# Patient Record
Sex: Female | Born: 1965
Health system: Southern US, Community
[De-identification: ages and names within clinical notes are randomized; demographics above are authoritative.]

## PROBLEM LIST (undated history)

## (undated) DIAGNOSIS — F32A Depression, unspecified: Secondary | ICD-10-CM

## (undated) DIAGNOSIS — M542 Cervicalgia: Secondary | ICD-10-CM

## (undated) DIAGNOSIS — M25511 Pain in right shoulder: Secondary | ICD-10-CM

## (undated) DIAGNOSIS — K219 Gastro-esophageal reflux disease without esophagitis: Secondary | ICD-10-CM

## (undated) DIAGNOSIS — N281 Cyst of kidney, acquired: Secondary | ICD-10-CM

## (undated) DIAGNOSIS — M47812 Spondylosis without myelopathy or radiculopathy, cervical region: Secondary | ICD-10-CM

## (undated) DIAGNOSIS — E782 Mixed hyperlipidemia: Secondary | ICD-10-CM

## (undated) DIAGNOSIS — R911 Solitary pulmonary nodule: Secondary | ICD-10-CM

## (undated) HISTORY — PX: INJECTION ANESTHETIC AGENT TO CERVICAL PLEXUS: SUR704

## (undated) HISTORY — DX: Pain in right shoulder: M25.511

## (undated) HISTORY — DX: Cervicalgia: M54.2

## (undated) HISTORY — DX: Mixed hyperlipidemia: E78.2

## (undated) HISTORY — DX: Cyst of kidney, acquired: N28.1

## (undated) HISTORY — DX: Depression, unspecified: F32.A

## (undated) HISTORY — DX: Solitary pulmonary nodule: R91.1

## (undated) HISTORY — DX: Spondylosis without myelopathy or radiculopathy, cervical region: M47.812

## (undated) HISTORY — DX: Gastro-esophageal reflux disease without esophagitis: K21.9

---

## 1983-12-27 DIAGNOSIS — F32A Depression, unspecified: Secondary | ICD-10-CM

## 1983-12-27 HISTORY — DX: Depression, unspecified: F32.A

## 2012-12-26 HISTORY — PX: BREAST BIOPSY: SHX20

## 2018-07-06 ENCOUNTER — Encounter: Payer: Self-pay | Admitting: Nurse Practitioner

## 2018-07-06 ENCOUNTER — Ambulatory Visit: Payer: BLUE CROSS/BLUE SHIELD | Admitting: Nurse Practitioner

## 2018-07-06 ENCOUNTER — Encounter (INDEPENDENT_AMBULATORY_CARE_PROVIDER_SITE_OTHER): Payer: Self-pay

## 2018-07-06 VITALS — BP 109/81 | HR 90 | Resp 16 | Ht 67.0 in | Wt 198.0 lb

## 2018-07-06 DIAGNOSIS — Z1239 Encounter for other screening for malignant neoplasm of breast: Secondary | ICD-10-CM

## 2018-07-06 DIAGNOSIS — E538 Deficiency of other specified B group vitamins: Secondary | ICD-10-CM

## 2018-07-06 DIAGNOSIS — E559 Vitamin D deficiency, unspecified: Secondary | ICD-10-CM | POA: Diagnosis not present

## 2018-07-06 DIAGNOSIS — Z0001 Encounter for general adult medical examination with abnormal findings: Secondary | ICD-10-CM

## 2018-07-06 DIAGNOSIS — F323 Major depressive disorder, single episode, severe with psychotic features: Secondary | ICD-10-CM | POA: Diagnosis not present

## 2018-07-06 MED ORDER — BUPROPION HCL ER (XL) 300 MG PO TB24: 300.0000 mg | ORAL_TABLET | Freq: Every day | ORAL | 4 refills | Status: DC

## 2018-07-06 MED ORDER — FLUOXETINE HCL 20 MG PO CAPS: 20.0000 mg | ORAL_CAPSULE | Freq: Every day | ORAL | 4 refills | Status: DC

## 2018-07-06 MED ORDER — CYANOCOBALAMIN 1000 MCG/ML IJ SOLN
1000.0000 ug | Freq: Once | INTRAMUSCULAR | Status: AC
  Administered 2018-07-06: 1000 ug via INTRAMUSCULAR

## 2018-07-06 NOTE — Progress Notes (Signed)
[facility] [place], [place] [address]  Internal MEDICINE  Office Visit Note  Patient Name: Catherine Cunningham  [id number]  [id number]  Date of Service: 08/01/2018  Pt is here for establishment of PCP.  Complaints/HPI   Chief Complaint  Patient presents with  . New Patient (Initial Visit)  . Fatigue  . Depression   The patient moved to [place] about 1 year ago from [place]. She has history of anxiety and depression and she is concerned about her weight. New job is relatively stressful and is working a lot of hours. Feels stressed and overwhelmed and fatigued. She knows this is contributing to her recent weight gain.    Current Medication: Outpatient Encounter Medications as of 07/06/2018  Medication Sig  . buPROPion (WELLBUTRIN XL) 300 MG 24 hr tablet Take 1 tablet (300 mg total) by mouth daily.  Marland Kitchen FLUoxetine (PROZAC) 20 MG capsule Take 1 capsule (20 mg total) by mouth daily.  . [DISCONTINUED] buPROPion (WELLBUTRIN XL) 300 MG 24 hr tablet Take 300 mg by mouth daily.  . [DISCONTINUED] FLUoxetine (PROZAC) 20 MG capsule Take 20 mg by mouth daily.  . [EXPIRED] cyanocobalamin ((VITAMIN B-12)) injection 1,000 mcg    No facility-administered encounter medications on file as of 07/06/2018.     Surgical History: Past Surgical History:  Procedure Laterality Date  . INJECTION ANESTHETIC AGENT TO CERVICAL PLEXUS      Medical History: No past medical history on file.  Family History: Family History  Problem Relation Age of Onset  . Breast cancer Mother   . Hodgkin's lymphoma Father   . Anuerysm Father   . Diabetes Mellitus I Brother     Social History   Socioeconomic History  . Marital status: Married    Spouse name: Not on file  . Number of children: Not on file  . Years of education: Not on file  . Highest education level: Not on file  Occupational History  . Not on file  Social Needs  . Financial resource strain: Not on file  . Food  insecurity:    Worry: Not on file    Inability: Not on file  . Transportation needs:    Medical: Not on file    Non-medical: Not on file  Tobacco Use  . Smoking status: Current Some Day Smoker    Types: Cigarettes  . Smokeless tobacco: Never Used  Substance and Sexual Activity  . Alcohol use: Yes    Alcohol/week: 8.4 oz    Types: 7 Glasses of wine, 7 Cans of beer per week    Comment: one a day wine or beer  . Drug use: Never  . Sexual activity: Not on file  Lifestyle  . Physical activity:    Days per week: Not on file    Minutes per session: Not on file  . Stress: Not on file  Relationships  . Social connections:    Talks on phone: Not on file    Gets together: Not on file    Attends religious service: Not on file    Active member of club or organization: Not on file    Attends meetings of clubs or organizations: Not on file    Relationship status: Not on file  . Intimate partner violence:    Fear of current or ex partner: Not on file    Emotionally abused: Not on file    Physically abused: Not on file    Forced sexual activity: Not on file  Other Topics Concern  . Not on file  Social History Narrative  . Not on file     Review of Systems  Constitutional: Positive for fatigue and unexpected weight change. Negative for activity change and chills.  HENT: Negative for congestion, postnasal drip, rhinorrhea, sneezing and sore throat.   Eyes: Negative.  Negative for redness.  Respiratory: Negative for cough, chest tightness, shortness of breath and wheezing.   Cardiovascular: Negative for chest pain and palpitations.  Gastrointestinal: Negative for abdominal pain, constipation, diarrhea, nausea and vomiting.  Endocrine: Negative for cold intolerance, heat intolerance, polydipsia, polyphagia and polyuria.  Genitourinary: Negative for dysuria and frequency.  Musculoskeletal: Negative for arthralgias, back pain, joint swelling and neck pain.  Skin: Negative for rash.   Allergic/Immunologic: Negative for environmental allergies.  Neurological: Negative for dizziness, tremors, numbness and headaches.  Hematological: Negative for adenopathy. Does not bruise/bleed easily.  Psychiatric/Behavioral: Positive for dysphoric mood. Negative for behavioral problems (Depression), sleep disturbance and suicidal ideas. The patient is nervous/anxious.     Today's Vitals   07/06/18 1521  BP: 109/81  Pulse: 90  Resp: 16  SpO2: 95%  Weight: 198 lb (89.8 kg)  Height: 5\' 7"  (1.702 m)    Physical Exam  Constitutional: She is oriented to person, place, and time. She appears well-developed and well-nourished. No distress.  HENT:  Head: Normocephalic and atraumatic.  Nose: Nose normal.  Mouth/Throat: Oropharynx is clear and moist. No oropharyngeal exudate.  Eyes: Pupils are equal, round, and reactive to light. Conjunctivae and EOM are normal.  Neck: Normal range of motion. Neck supple. No JVD present. Carotid bruit is not present. No tracheal deviation present. No thyromegaly present.  Cardiovascular: Normal rate, regular rhythm and normal heart sounds. Exam reveals no gallop and no friction rub.  No murmur heard. Pulmonary/Chest: Effort normal and breath sounds normal. No respiratory distress. She has no wheezes. She has no rales. She exhibits no tenderness.  Abdominal: Soft. Bowel sounds are normal. There is no tenderness.  Musculoskeletal: Normal range of motion.  Lymphadenopathy:    She has no cervical adenopathy.  Neurological: She is alert and oriented to person, place, and time. No cranial nerve deficit.  Skin: Skin is warm and dry. Capillary refill takes less than 2 seconds. She is not diaphoretic.  Psychiatric: She has a normal mood and affect. Her behavior is normal. Judgment and thought content normal.  Nursing note and vitals reviewed.  Assessment/Plan:  1. Vitamin B 12 deficiency b12 injection administered today - cyanocobalamin ((VITAMIN B-12))  injection 1,000 mcg  2. Vitamin D deficiency Check vitamin d levels. Treat deficiency as indicated  - Vitamin D 1,25 dihydroxy  3. Major depressive disorder, single episode with psychotic features ([place]) - buPROPion (WELLBUTRIN XL) 300 MG 24 hr tablet; Take 1 tablet (300 mg total) by mouth daily.  Dispense: 90 tablet; Refill: 4 - FLUoxetine (PROZAC) 20 MG capsule; Take 1 capsule (20 mg total) by mouth daily.  Dispense: 90 capsule; Refill: 4  4. Screening for breast cancer - MM DIGITAL SCREENING BILATERAL; Future  5. Encounter for general adult medical examination with abnormal findings - CBC with Differential/Platelet - Comprehensive metabolic panel - T4, free - TSH - Lipid panel  General Counseling: Mashelle verbalizes understanding of the findings of todays visit and agrees with plan of treatment. I have discussed any further diagnostic evaluation that may be needed or ordered today. We also reviewed her medications today. she has been encouraged to call the office with any questions or  concerns that should arise related to todays visit.    Counseling:  Obesity Counseling: Risk Assessment: An assessment of behavioral risk factors was made today and includes lack of exercise sedentary lifestyle, lack of portion control and poor dietary habits.  Risk Modification Advice: She was counseled on portion control guidelines. Restricting daily caloric intake to 1500. The detrimental long term effects of obesity on her health and ongoing poor compliance was also discussed with the patient.   There is a liability release in patients' chart. There has been a 10 minute discussion about the side effects including but not limited to elevated blood pressure, anxiety, lack of sleep and dry mouth. Pt understands and will like to start/continue on appetite suppressant at this time. There will be one month RX given at the time of visit with proper follow up. Nova diet plan with restricted calories is  given to the pt. Pt understands and agrees with  plan of treatment  Orders Placed This Encounter  Procedures  . MM DIGITAL SCREENING BILATERAL  . CBC with Differential/Platelet  . Comprehensive metabolic panel  . T4, free  . TSH  . Lipid panel  . Vitamin D 1,25 dihydroxy    Meds ordered this encounter  Medications  . buPROPion (WELLBUTRIN XL) 300 MG 24 hr tablet    Sig: Take 1 tablet (300 mg total) by mouth daily.    Dispense:  90 tablet    Refill:  4    Order Specific Question:   Supervising Provider    Answer:   Lavera Guise [[id number]]  . FLUoxetine (PROZAC) 20 MG capsule    Sig: Take 1 capsule (20 mg total) by mouth daily.    Dispense:  90 capsule    Refill:  4    Order Specific Question:   Supervising Provider    Answer:   Lavera Guise [[id number]]  . cyanocobalamin ((VITAMIN B-12)) injection 1,000 mcg    Time spent: 30 Minutes

## 2018-08-01 ENCOUNTER — Encounter: Payer: Self-pay | Admitting: Nurse Practitioner

## 2018-08-01 DIAGNOSIS — Z0001 Encounter for general adult medical examination with abnormal findings: Secondary | ICD-10-CM | POA: Insufficient documentation

## 2018-08-01 DIAGNOSIS — E559 Vitamin D deficiency, unspecified: Secondary | ICD-10-CM | POA: Insufficient documentation

## 2018-08-01 DIAGNOSIS — E538 Deficiency of other specified B group vitamins: Secondary | ICD-10-CM | POA: Insufficient documentation

## 2018-08-01 DIAGNOSIS — F323 Major depressive disorder, single episode, severe with psychotic features: Secondary | ICD-10-CM | POA: Insufficient documentation

## 2018-10-02 ENCOUNTER — Telehealth: Payer: Self-pay

## 2018-10-02 NOTE — Telephone Encounter (Signed)
Patient called regarding mammogram order, I advised patient her order was at [facility] breast center and she requested a diagnostic mammo due to hx of abnormal mammograms, I tried explaining to patient that if no abnormalities were found during exam then a baseline would be ordered since we do not have any previous records sent from her older pcp, it would have to be ordered as a regular and not diagnostic pt did not understand why we do not have records yet and disconnected the call. Beth

## 2018-10-03 ENCOUNTER — Other Ambulatory Visit: Payer: Self-pay | Admitting: Nurse Practitioner

## 2018-10-03 DIAGNOSIS — N63 Unspecified lump in unspecified breast: Secondary | ICD-10-CM

## 2018-10-03 NOTE — Progress Notes (Signed)
Scanned in patient history of mammograms. Beth

## 2018-10-22 ENCOUNTER — Ambulatory Visit
Admission: RE | Admit: 2018-10-22 | Discharge: 2018-10-22 | Disposition: A | Discharge status: Home / Self Care | Payer: BLUE CROSS/BLUE SHIELD | Source: Ambulatory Visit | Attending: Nurse Practitioner | Admitting: Nurse Practitioner

## 2018-10-22 ENCOUNTER — Other Ambulatory Visit: Payer: Self-pay | Admitting: Nurse Practitioner

## 2018-10-22 ENCOUNTER — Encounter (HOSPITAL_COMMUNITY): Payer: Self-pay

## 2018-10-22 DIAGNOSIS — N63 Unspecified lump in unspecified breast: Secondary | ICD-10-CM | POA: Diagnosis not present

## 2018-10-22 DIAGNOSIS — R921 Mammographic calcification found on diagnostic imaging of breast: Secondary | ICD-10-CM

## 2018-10-22 DIAGNOSIS — R928 Other abnormal and inconclusive findings on diagnostic imaging of breast: Secondary | ICD-10-CM

## 2018-10-22 DIAGNOSIS — Z1239 Encounter for other screening for malignant neoplasm of breast: Secondary | ICD-10-CM | POA: Diagnosis not present

## 2018-10-22 DIAGNOSIS — N6314 Unspecified lump in the right breast, lower inner quadrant: Secondary | ICD-10-CM | POA: Diagnosis not present

## 2018-10-22 DIAGNOSIS — N6313 Unspecified lump in the right breast, lower outer quadrant: Secondary | ICD-10-CM | POA: Diagnosis not present

## 2018-10-23 DIAGNOSIS — E559 Vitamin D deficiency, unspecified: Secondary | ICD-10-CM | POA: Diagnosis not present

## 2018-10-23 DIAGNOSIS — Z0001 Encounter for general adult medical examination with abnormal findings: Secondary | ICD-10-CM | POA: Diagnosis not present

## 2018-10-24 ENCOUNTER — Ambulatory Visit
Admission: RE | Admit: 2018-10-24 | Discharge: 2018-10-24 | Disposition: A | Discharge status: Home / Self Care | Payer: BLUE CROSS/BLUE SHIELD | Source: Ambulatory Visit | Attending: Nurse Practitioner | Admitting: Nurse Practitioner

## 2018-10-24 DIAGNOSIS — R928 Other abnormal and inconclusive findings on diagnostic imaging of breast: Secondary | ICD-10-CM | POA: Diagnosis not present

## 2018-10-24 DIAGNOSIS — R921 Mammographic calcification found on diagnostic imaging of breast: Secondary | ICD-10-CM | POA: Insufficient documentation

## 2018-10-24 HISTORY — PX: BREAST BIOPSY: SHX20

## 2018-10-25 LAB — SURGICAL PATHOLOGY

## 2018-10-29 LAB — CBC WITH DIFFERENTIAL/PLATELET
Basophils Absolute: 0.1 10*3/uL (ref 0.0–0.2)
Basos: 1 %
EOS (ABSOLUTE): 0.2 10*3/uL (ref 0.0–0.4)
EOS: 3 %
HEMATOCRIT: 44.2 % (ref 34.0–46.6)
Hemoglobin: 15.1 g/dL (ref 11.1–15.9)
Immature Grans (Abs): 0 10*3/uL (ref 0.0–0.1)
Immature Granulocytes: 0 %
LYMPHS ABS: 2.1 10*3/uL (ref 0.7–3.1)
Lymphs: 40 %
MCH: 29.3 pg (ref 26.6–33.0)
MCHC: 34.2 g/dL (ref 31.5–35.7)
MCV: 86 fL (ref 79–97)
MONOS ABS: 0.4 10*3/uL (ref 0.1–0.9)
Monocytes: 7 %
NEUTROS PCT: 49 %
Neutrophils Absolute: 2.5 10*3/uL (ref 1.4–7.0)
PLATELETS: 375 10*3/uL (ref 150–450)
RBC: 5.16 x10E6/uL (ref 3.77–5.28)
RDW: 12.4 % (ref 12.3–15.4)
WBC: 5.3 10*3/uL (ref 3.4–10.8)

## 2018-10-29 LAB — VITAMIN D 1,25 DIHYDROXY
VITAMIN D 1, 25 (OH) TOTAL: 35 pg/mL
Vitamin D2 1, 25 (OH)2: <10 pg/mL
Vitamin D3 1, 25 (OH)2: 35 pg/mL

## 2018-10-29 LAB — COMPREHENSIVE METABOLIC PANEL
A/G RATIO: 2.2 (ref 1.2–2.2)
ALK PHOS: 85 IU/L (ref 39–117)
ALT: 18 IU/L (ref 0–32)
AST: 18 IU/L (ref 0–40)
Albumin: 4.9 g/dL (ref 3.5–5.5)
BILIRUBIN TOTAL: 0.6 mg/dL (ref 0.0–1.2)
BUN / CREAT RATIO: 20 (ref 9–23)
BUN: 19 mg/dL (ref 6–24)
CO2: 24 mmol/L (ref 20–29)
CREATININE: 0.95 mg/dL (ref 0.57–1.00)
Calcium: 10.2 mg/dL (ref 8.7–10.2)
Chloride: 103 mmol/L (ref 96–106)
GFR calc Af Amer: 79 mL/min/{1.73_m2} (ref >59)
GFR, EST NON AFRICAN AMERICAN: 68 mL/min/{1.73_m2} (ref >59)
GLOBULIN, TOTAL: 2.2 g/dL (ref 1.5–4.5)
Glucose: 101 mg/dL — ABNORMAL HIGH (ref 65–99)
Potassium: 5.1 mmol/L (ref 3.5–5.2)
SODIUM: 142 mmol/L (ref 134–144)
Total Protein: 7.1 g/dL (ref 6.0–8.5)

## 2018-10-29 LAB — LIPID PANEL
CHOL/HDL RATIO: 2.8 ratio (ref 0.0–4.4)
Cholesterol, Total: 234 mg/dL — ABNORMAL HIGH (ref 100–199)
HDL: 85 mg/dL (ref >39)
LDL Calculated: 132 mg/dL — ABNORMAL HIGH (ref 0–99)
Triglycerides: 85 mg/dL (ref 0–149)
VLDL CHOLESTEROL CAL: 17 mg/dL (ref 5–40)

## 2018-10-29 LAB — TSH: TSH: 2.12 u[IU]/mL (ref 0.450–4.500)

## 2018-10-29 LAB — T4, FREE: FREE T4: 1.21 ng/dL (ref 0.82–1.77)

## 2019-01-07 ENCOUNTER — Ambulatory Visit: Payer: Self-pay | Admitting: Nurse Practitioner

## 2020-08-19 ENCOUNTER — Ambulatory Visit: Payer: BLUE CROSS/BLUE SHIELD | Admitting: Hospice and Palliative Medicine

## 2020-08-28 ENCOUNTER — Other Ambulatory Visit: Payer: BLUE CROSS/BLUE SHIELD

## 2020-10-02 ENCOUNTER — Ambulatory Visit: Payer: BC Managed Care – PPO | Admitting: Nurse Practitioner

## 2020-10-02 ENCOUNTER — Encounter: Payer: Self-pay | Admitting: Nurse Practitioner

## 2020-10-02 VITALS — BP 108/86 | HR 81 | Temp 97.9°F | Resp 16 | Ht 67.0 in | Wt 194.2 lb

## 2020-10-02 DIAGNOSIS — E538 Deficiency of other specified B group vitamins: Secondary | ICD-10-CM | POA: Diagnosis not present

## 2020-10-02 DIAGNOSIS — F323 Major depressive disorder, single episode, severe with psychotic features: Secondary | ICD-10-CM

## 2020-10-02 DIAGNOSIS — R5383 Other fatigue: Secondary | ICD-10-CM

## 2020-10-02 DIAGNOSIS — Z1231 Encounter for screening mammogram for malignant neoplasm of breast: Secondary | ICD-10-CM | POA: Diagnosis not present

## 2020-10-02 NOTE — Progress Notes (Signed)
[facility] [place], [place] [address]  Internal MEDICINE  Office Visit Note  Patient Name: Catherine Cunningham  [id number]  [id number]  Date of Service: 10/18/2020  Chief Complaint  Patient presents with  . Follow-up    dioagnostic mammogram, depression meds, over the last month pt has SOB and racing heart sometimes at night, pt thinks its anxiety  . Quality Metric Gaps    HepC, TDAP, pap, colonoscopy  . controlled substance policy    Reviewed    The patient is here for routine follow up. She has not been in the office for some time as she was without insurance for some time. She went for a while without medication to treat anxiety and depression. She has noted increased irritability and tearfulness. She frequently snaps at family members and friends. She is overdue to have routine, fasting labs and screening mammogram.       Current Medication: Outpatient Encounter Medications as of 10/02/2020  Medication Sig  . buPROPion (WELLBUTRIN XL) 300 MG 24 hr tablet Take 1 tablet (300 mg total) by mouth daily. (Patient not taking: Reported on 10/02/2020)  . FLUoxetine (PROZAC) 20 MG capsule Take 1 capsule (20 mg total) by mouth daily. (Patient not taking: Reported on 10/02/2020)   No facility-administered encounter medications on file as of 10/02/2020.    Surgical History: Past Surgical History:  Procedure Laterality Date  . BREAST BIOPSY Left 2014   neg- Ribbon clip  . BREAST BIOPSY Left 10/24/2018   Affirm Bx- Coil Clip- path pending  . INJECTION ANESTHETIC AGENT TO CERVICAL PLEXUS      Medical History: History reviewed. No pertinent past medical history.  Family History: Family History  Problem Relation Age of Onset  . Breast cancer Mother 42  . Hodgkin's lymphoma Father   . Anuerysm Father   . Diabetes Mellitus I Brother     Social History   Socioeconomic History  . Marital status: Married    Spouse name: Not on file  . Number of children:  Not on file  . Years of education: Not on file  . Highest education level: Not on file  Occupational History  . Not on file  Tobacco Use  . Smoking status: Current Some Day Smoker    Types: Cigarettes  . Smokeless tobacco: Never Used  . Tobacco comment: smokes when she drinks  Substance and Sexual Activity  . Alcohol use: Yes    Alcohol/week: 14.0 standard drinks    Types: 7 Glasses of wine, 7 Cans of beer per week    Comment: one a day wine or beer  . Drug use: Never  . Sexual activity: Not on file  Other Topics Concern  . Not on file  Social History Narrative  . Not on file   Social Determinants of Health   Financial Resource Strain:   . Difficulty of Paying Living Expenses: Not on file  Food Insecurity:   . Worried About Charity fundraiser in the Last Year: Not on file  . Ran Out of Food in the Last Year: Not on file  Transportation Needs:   . Lack of Transportation (Medical): Not on file  . Lack of Transportation (Non-Medical): Not on file  Physical Activity:   . Days of Exercise per Week: Not on file  . Minutes of Exercise per Session: Not on file  Stress:   . Feeling of Stress : Not on file  Social Connections:   . Frequency of Communication  with Friends and Family: Not on file  . Frequency of Social Gatherings with Friends and Family: Not on file  . Attends Religious Services: Not on file  . Active Member of Clubs or Organizations: Not on file  . Attends Archivist Meetings: Not on file  . Marital Status: Not on file  Intimate Partner Violence:   . Fear of Current or Ex-Partner: Not on file  . Emotionally Abused: Not on file  . Physically Abused: Not on file  . Sexually Abused: Not on file      Review of Systems  Constitutional: Positive for fatigue. Negative for activity change, chills and unexpected weight change.  HENT: Negative for congestion, postnasal drip, rhinorrhea, sneezing and sore throat.   Respiratory: Negative for cough, chest  tightness, shortness of breath and wheezing.   Cardiovascular: Negative for chest pain and palpitations.  Gastrointestinal: Negative for abdominal pain, constipation, diarrhea, nausea and vomiting.  Endocrine: Negative for cold intolerance, heat intolerance, polydipsia and polyuria.  Musculoskeletal: Negative for arthralgias, back pain, joint swelling, neck pain and neck stiffness.  Skin: Negative for rash.  Allergic/Immunologic: Negative for environmental allergies.  Neurological: Negative for dizziness, tremors, numbness and headaches.  Hematological: Negative for adenopathy. Does not bruise/bleed easily.  Psychiatric/Behavioral: Positive for dysphoric mood. Negative for behavioral problems (Depression), sleep disturbance and suicidal ideas. The patient is nervous/anxious.     Today's Vitals   10/02/20 1043  BP: 108/86  Pulse: 81  Resp: 16  Temp: 97.9 F (36.6 C)  SpO2: 98%  Weight: 194 lb 3.4 oz (88.1 kg)  Height: 5\' 7"  (1.702 m)   Body mass index is 30.42 kg/m.  Physical Exam Vitals and nursing note reviewed.  Constitutional:      General: She is not in acute distress.    Appearance: Normal appearance. She is well-developed. She is obese. She is not diaphoretic.  HENT:     Head: Normocephalic and atraumatic.     Nose: Nose normal.     Mouth/Throat:     Pharynx: No oropharyngeal exudate.  Eyes:     Pupils: Pupils are equal, round, and reactive to light.  Neck:     Thyroid: No thyromegaly.     Vascular: No carotid bruit or JVD.     Trachea: No tracheal deviation.  Cardiovascular:     Rate and Rhythm: Normal rate and regular rhythm.     Heart sounds: Normal heart sounds. No murmur heard.  No friction rub. No gallop.   Pulmonary:     Effort: Pulmonary effort is normal. No respiratory distress.     Breath sounds: Normal breath sounds. No wheezing or rales.  Chest:     Chest wall: No tenderness.  Abdominal:     Palpations: Abdomen is soft.  Musculoskeletal:         General: Normal range of motion.     Cervical back: Normal range of motion and neck supple.  Lymphadenopathy:     Cervical: No cervical adenopathy.  Skin:    General: Skin is warm and dry.  Neurological:     General: No focal deficit present.     Mental Status: She is alert and oriented to person, place, and time.     Cranial Nerves: No cranial nerve deficit.  Psychiatric:        Attention and Perception: Attention and perception normal.        Mood and Affect: Mood is anxious and depressed. Affect is tearful.  Speech: Speech normal.        Behavior: Behavior normal. Behavior is cooperative.        Thought Content: Thought content normal.        Cognition and Memory: Cognition and memory normal.        Judgment: Judgment normal.     Assessment/Plan: 1. Other fatigue Check labs including thyroid and anemia panels for further evaluation.  2. Vitamin B 12 deficiency Check labs including thyroid and anemia panels for further evaluation.  3. Major depressive disorder, single episode with psychotic features ([place]) Restart wellbutrin as previously prescribed. Written prescription provided today.   4. Encounter for screening mammogram for malignant neoplasm of breast - MM DIGITAL SCREENING BILATERAL; Future  General Counseling: lorrane mccay understanding of the findings of todays visit and agrees with plan of treatment. I have discussed any further diagnostic evaluation that may be needed or ordered today. We also reviewed her medications today. she has been encouraged to call the office with any questions or concerns that should arise related to todays visit.  This patient was seen by Leretha Pol FNP Collaboration with Dr Lavera Guise as a part of collaborative care agreement  Orders Placed This Encounter  Procedures  . MM DIGITAL SCREENING BILATERAL      Total time spent: 30 Minutes   Time spent includes review of chart, medications, test results, and follow up  plan with the patient.      Dr Lavera Guise Internal medicine

## 2020-10-18 ENCOUNTER — Encounter: Payer: Self-pay | Admitting: Nurse Practitioner

## 2020-10-18 DIAGNOSIS — Z1231 Encounter for screening mammogram for malignant neoplasm of breast: Secondary | ICD-10-CM

## 2020-10-18 DIAGNOSIS — R5383 Other fatigue: Secondary | ICD-10-CM | POA: Insufficient documentation

## 2020-10-18 HISTORY — DX: Encounter for screening mammogram for malignant neoplasm of breast: Z12.31

## 2020-11-13 ENCOUNTER — Other Ambulatory Visit: Payer: BC Managed Care – PPO | Admitting: Nurse Practitioner

## 2020-12-02 DIAGNOSIS — M25562 Pain in left knee: Secondary | ICD-10-CM | POA: Insufficient documentation

## 2020-12-02 DIAGNOSIS — M238X2 Other internal derangements of left knee: Secondary | ICD-10-CM | POA: Diagnosis not present

## 2020-12-08 DIAGNOSIS — M25562 Pain in left knee: Secondary | ICD-10-CM | POA: Diagnosis not present

## 2020-12-08 DIAGNOSIS — M23332 Other meniscus derangements, other medial meniscus, left knee: Secondary | ICD-10-CM | POA: Diagnosis not present

## 2020-12-15 ENCOUNTER — Other Ambulatory Visit: Payer: Self-pay | Admitting: Nurse Practitioner

## 2020-12-15 DIAGNOSIS — F323 Major depressive disorder, single episode, severe with psychotic features: Secondary | ICD-10-CM

## 2020-12-15 MED ORDER — FLUOXETINE HCL 20 MG PO CAPS: 20.0000 mg | ORAL_CAPSULE | Freq: Every day | ORAL | 1 refills | Status: DC

## 2020-12-15 MED ORDER — BUPROPION HCL ER (XL) 300 MG PO TB24: 300.0000 mg | ORAL_TABLET | Freq: Every day | ORAL | 1 refills | Status: DC

## 2020-12-16 ENCOUNTER — Other Ambulatory Visit: Payer: BC Managed Care – PPO | Admitting: Nurse Practitioner

## 2021-02-08 ENCOUNTER — Other Ambulatory Visit: Payer: Self-pay

## 2021-02-08 ENCOUNTER — Ambulatory Visit
Admission: RE | Admit: 2021-02-08 | Discharge: 2021-02-08 | Disposition: A | Discharge status: Home / Self Care | Payer: BC Managed Care – PPO | Source: Ambulatory Visit | Attending: Nurse Practitioner | Admitting: Nurse Practitioner

## 2021-02-08 DIAGNOSIS — Z1231 Encounter for screening mammogram for malignant neoplasm of breast: Secondary | ICD-10-CM | POA: Diagnosis not present

## 2021-02-10 ENCOUNTER — Other Ambulatory Visit: Payer: BC Managed Care – PPO | Admitting: Hospice and Palliative Medicine

## 2021-02-10 ENCOUNTER — Ambulatory Visit: Payer: Self-pay

## 2021-02-12 NOTE — Progress Notes (Signed)
Negative mammogram

## 2021-02-22 ENCOUNTER — Encounter: Payer: Self-pay | Admitting: Nurse Practitioner

## 2021-02-22 ENCOUNTER — Ambulatory Visit: Payer: BC Managed Care – PPO | Admitting: Nurse Practitioner

## 2021-02-22 ENCOUNTER — Telehealth: Payer: Self-pay | Admitting: Nurse Practitioner

## 2021-02-22 ENCOUNTER — Other Ambulatory Visit: Payer: Self-pay

## 2021-02-22 VITALS — BP 112/76 | HR 63 | Temp 98.4°F | Ht 67.0 in | Wt 198.1 lb

## 2021-02-22 DIAGNOSIS — E559 Vitamin D deficiency, unspecified: Secondary | ICD-10-CM

## 2021-02-22 DIAGNOSIS — Z1211 Encounter for screening for malignant neoplasm of colon: Secondary | ICD-10-CM

## 2021-02-22 DIAGNOSIS — F323 Major depressive disorder, single episode, severe with psychotic features: Secondary | ICD-10-CM | POA: Diagnosis not present

## 2021-02-22 DIAGNOSIS — H6123 Impacted cerumen, bilateral: Secondary | ICD-10-CM | POA: Diagnosis not present

## 2021-02-22 DIAGNOSIS — E538 Deficiency of other specified B group vitamins: Secondary | ICD-10-CM

## 2021-02-22 DIAGNOSIS — Z0000 Encounter for general adult medical examination without abnormal findings: Secondary | ICD-10-CM | POA: Insufficient documentation

## 2021-02-22 DIAGNOSIS — Z7689 Persons encountering health services in other specified circumstances: Secondary | ICD-10-CM | POA: Diagnosis not present

## 2021-02-22 HISTORY — DX: Encounter for screening for malignant neoplasm of colon: Z12.11

## 2021-02-22 HISTORY — DX: Encounter for general adult medical examination without abnormal findings: Z00.00

## 2021-02-22 MED ORDER — BUPROPION HCL ER (XL) 300 MG PO TB24: 300.0000 mg | ORAL_TABLET | Freq: Every day | ORAL | 1 refills | Status: DC

## 2021-02-22 MED ORDER — FLUOXETINE HCL 20 MG PO CAPS: 20.0000 mg | ORAL_CAPSULE | Freq: Every day | ORAL | 1 refills | Status: DC

## 2021-02-22 NOTE — Patient Instructions (Signed)

## 2021-02-22 NOTE — Progress Notes (Signed)
New Patient Office Visit  Subjective:  Patient ID: Catherine Cunningham, female    DOB: Aug 23, 1966  Age: 55 y.o. MRN: [id number]  CC:  Chief Complaint  Patient presents with  . New Patient (Initial Visit)  . Ear Fullness    HPI Catherine Cunningham presents for establishment of new primary care provider. Today, she states that ears feel full and itchy. This is worse on the right side than left. Has history of cerumen impaction requiring ear lavage. Denies ear pain, nasal or sinus congestion. Denies headache or fever. Denies nausea or vomiting, or unusual abdominal pain.  She does have concern regarding her most recent mammogram. Her results were negative. In the past, she has required additional, diagnostic images, even biopsies. Repeat mammograms were done every six months at one point. She denies new lumps, cysts, breast pain, or asymmetry in the breasts. She denies axillary lymph node enlargement.  She has had her Pfizer vaccines against COVID 19 and has had single booster. She is due to have annual wellness visit with pap smear and routine, fasting blood work.  The patient is treated for depression and anxiety. Her symptoms are stable with current doses of prozac and wellbutrin.   Past Medical History:  Diagnosis Date  . Depression    Phreesia 02/19/2021    Past Surgical History:  Procedure Laterality Date  . BREAST BIOPSY Left 2014   neg- Ribbon clip  . BREAST BIOPSY Left 10/24/2018   Affirm Bx- Coil Clip- path pending  . INJECTION ANESTHETIC AGENT TO CERVICAL PLEXUS      Family History  Problem Relation Age of Onset  . Breast cancer Mother 25  . Hodgkin's lymphoma Father   . Anuerysm Father   . Diabetes Mellitus I Brother     Social History   Socioeconomic History  . Marital status: Married    Spouse name: Not on file  . Number of children: Not on file  . Years of education: Not on file  . Highest education level: Not on file  Occupational History  . Not on file   Tobacco Use  . Smoking status: Current Some Day Smoker    Types: Cigarettes  . Smokeless tobacco: Never Used  . Tobacco comment: smokes when she drinks  Substance and Sexual Activity  . Alcohol use: Yes    Alcohol/week: 14.0 standard drinks    Types: 7 Glasses of wine, 7 Cans of beer per week    Comment: one a day wine or beer  . Drug use: Never  . Sexual activity: Not on file  Other Topics Concern  . Not on file  Social History Narrative  . Not on file   Social Determinants of Health   Financial Resource Strain: Not on file  Food Insecurity: Not on file  Transportation Needs: Not on file  Physical Activity: Not on file  Stress: Not on file  Social Connections: Not on file  Intimate Partner Violence: Not on file    ROS Review of Systems  HENT:       Feelings of ear fullness and itching.   All other systems reviewed and are negative.   Objective:   Today's Vitals   02/22/21 1019  BP: 112/76  Pulse: 63  Temp: 98.4 F (36.9 C)  SpO2: 96%  Weight: 198 lb 1.6 oz (89.9 kg)  Height: 5\' 7"  (1.702 m)   Body mass index is 31.03 kg/m.    Physical Exam Vitals and nursing note reviewed.  Constitutional:  Appearance: Normal appearance.  HENT:     Head: Normocephalic and atraumatic.     Right Ear: Tenderness present. There is impacted cerumen. Tympanic membrane is not erythematous or bulging.     Left Ear: Tenderness present. There is impacted cerumen. Tympanic membrane is not erythematous or bulging.     Ears:     Comments: Bilateral ear lavage performed today. There was good clearance of cerumen. No evidence of erythema or trauma noted. Patient tolerated the procedure well.     Nose: No congestion.     Right Sinus: No maxillary sinus tenderness or frontal sinus tenderness.     Left Sinus: No maxillary sinus tenderness or frontal sinus tenderness.     Mouth/Throat:     Pharynx: No posterior oropharyngeal erythema.  Eyes:     Pupils: Pupils are equal, round,  and reactive to light.  Neck:     Thyroid: No thyromegaly.     Vascular: No carotid bruit.  Cardiovascular:     Rate and Rhythm: Normal rate and regular rhythm.     Pulses: Normal pulses.     Heart sounds: Normal heart sounds.  Pulmonary:     Effort: Pulmonary effort is normal.     Breath sounds: Normal breath sounds. No wheezing.  Abdominal:     Palpations: Abdomen is soft.     Tenderness: There is no abdominal tenderness.  Musculoskeletal:        General: Normal range of motion.     Cervical back: Normal range of motion and neck supple.  Skin:    General: Skin is warm and dry.  Neurological:     General: No focal deficit present.     Mental Status: She is alert and oriented to person, place, and time.  Psychiatric:        Mood and Affect: Mood normal.        Behavior: Behavior normal.        Thought Content: Thought content normal.        Judgment: Judgment normal.     Assessment & Plan:  1. Encounter to establish care Patient seen today to establish primary care.   2. Bilateral impacted cerumen Bilateral ear lavage performed today with good results. Canal clear without evidence of erythema or trauma. Patient tolerated procedure well.  - Ear Lavage  3. Major depressive disorder, single episode with psychotic features ([place]) Stable. With current medication. Continue prozac and wellbutrin as prescribed. Refills sent to her pharmacy today.  - buPROPion (WELLBUTRIN XL) 300 MG 24 hr tablet; Take 1 tablet (300 mg total) by mouth daily.  Dispense: 90 tablet; Refill: 1 - FLUoxetine (PROZAC) 20 MG capsule; Take 1 capsule (20 mg total) by mouth daily.  Dispense: 90 capsule; Refill: 1  4. Vitamin D deficiency Check vitamin d level with fasting blood work.  - VITAMIN D 25 Hydroxy (Vit-D Deficiency, Fractures); Future  5. Vitamin B 12 deficiency Check vitamin b12 level with fasting blood work.  - B12; Future  6. Screening for colon cancer cologuard ordered today.  -  Cologuard  7. Healthcare maintenance Routine, fasting labs ordered for next visit. cologuard to be obtained.  - Cologuard - CBC; Future - Comprehensive metabolic panel; Future - TSH; Future - Lipid panel; Future - Hemoglobin A1c; Future - VITAMIN D 25 Hydroxy (Vit-D Deficiency, Fractures); Future - B12; Future   Problem List Items Addressed This Visit      Nervous and Auditory   Bilateral impacted cerumen   Relevant Orders  Ear Lavage     Other   Vitamin B 12 deficiency   Relevant Orders   B12   Vitamin D deficiency   Relevant Orders   VITAMIN D 25 Hydroxy (Vit-D Deficiency, Fractures)   Major depressive disorder, single episode with psychotic features ([facility])   Relevant Medications   buPROPion (WELLBUTRIN XL) 300 MG 24 hr tablet   FLUoxetine (PROZAC) 20 MG capsule   Encounter to establish care - Primary   Healthcare maintenance   Relevant Orders   Cologuard   CBC   Comprehensive metabolic panel   TSH   Lipid panel   Hemoglobin A1c   VITAMIN D 25 Hydroxy (Vit-D Deficiency, Fractures)   B12   Screening for colon cancer   Relevant Orders   Cologuard      Outpatient Encounter Medications as of 02/22/2021  Medication Sig  . [DISCONTINUED] buPROPion (WELLBUTRIN XL) 300 MG 24 hr tablet Take 1 tablet (300 mg total) by mouth daily.  . [DISCONTINUED] FLUoxetine (PROZAC) 20 MG capsule Take 1 capsule (20 mg total) by mouth daily.  Marland Kitchen buPROPion (WELLBUTRIN XL) 300 MG 24 hr tablet Take 1 tablet (300 mg total) by mouth daily.  Marland Kitchen FLUoxetine (PROZAC) 20 MG capsule Take 1 capsule (20 mg total) by mouth daily.   No facility-administered encounter medications on file as of 02/22/2021.   Time spent with the patient was approximately 45 minutes. This time included reviewing progress notes, labs, imaging studies, and discussing plan for follow up.   Follow-up: Return in about 2 months (around 04/22/2021) for CPE with Pap and FBW few days before. Marland Kitchen   Ronnell Freshwater, NP

## 2021-02-22 NOTE — Telephone Encounter (Signed)
Labs have been ordered in system. AS, CMA

## 2021-02-22 NOTE — Telephone Encounter (Signed)
Additional note- patient is scheduled 04/19/21 for her CPE-

## 2021-02-25 ENCOUNTER — Other Ambulatory Visit: Payer: Self-pay | Admitting: Nurse Practitioner

## 2021-02-25 DIAGNOSIS — F323 Major depressive disorder, single episode, severe with psychotic features: Secondary | ICD-10-CM

## 2021-02-25 MED ORDER — FLUOXETINE HCL 20 MG PO CAPS: 20.0000 mg | ORAL_CAPSULE | Freq: Every day | ORAL | 1 refills | Status: DC

## 2021-02-25 MED ORDER — BUPROPION HCL ER (XL) 300 MG PO TB24: 300.0000 mg | ORAL_TABLET | Freq: Every day | ORAL | 1 refills | Status: DC

## 2021-02-25 NOTE — Progress Notes (Signed)
Patient asked to change pharmacies to [facility] and [address] in [place]. New 90 day prescriptions sent with 1 refill each .

## 2021-02-26 ENCOUNTER — Encounter: Payer: Self-pay | Admitting: Nurse Practitioner

## 2021-02-26 DIAGNOSIS — F323 Major depressive disorder, single episode, severe with psychotic features: Secondary | ICD-10-CM

## 2021-02-28 MED ORDER — FLUOXETINE HCL 20 MG PO CAPS: 20.0000 mg | ORAL_CAPSULE | Freq: Every day | ORAL | 1 refills | Status: DC

## 2021-02-28 MED ORDER — BUPROPION HCL ER (XL) 300 MG PO TB24: 300.0000 mg | ORAL_TABLET | Freq: Every day | ORAL | 1 refills | Status: DC

## 2021-03-02 DIAGNOSIS — Z1211 Encounter for screening for malignant neoplasm of colon: Secondary | ICD-10-CM | POA: Diagnosis not present

## 2021-03-10 LAB — COLOGUARD: Cologuard: POSITIVE — AB

## 2021-03-12 ENCOUNTER — Telehealth: Payer: Self-pay | Admitting: Nurse Practitioner

## 2021-03-15 ENCOUNTER — Encounter: Payer: Self-pay | Admitting: Nurse Practitioner

## 2021-03-15 ENCOUNTER — Other Ambulatory Visit: Payer: Self-pay | Admitting: Nurse Practitioner

## 2021-03-15 DIAGNOSIS — R195 Other fecal abnormalities: Secondary | ICD-10-CM

## 2021-03-15 NOTE — Progress Notes (Signed)
Spoke with patient at 840am to inform her of positive cologuard results. A referral was placed to Dr. Mickel Duhamel, GI for further evaluation and treatment.

## 2021-03-23 NOTE — Telephone Encounter (Signed)
Opened in error

## 2021-04-05 ENCOUNTER — Telehealth: Payer: Self-pay | Admitting: Nurse Practitioner

## 2021-04-05 NOTE — Telephone Encounter (Signed)
Patient called office to inquire if we PCP had any available appointments for today, I advised there are no available openings today- patient has swelling under her left ear, she noticed while eating lunch- patient is going to urgent care on [address]- patient asked if PCP could give her a call today- advised I will send message. Thank you

## 2021-04-05 NOTE — Telephone Encounter (Signed)
Patient states that late this morning she was eating and the chewing motion did not feel right. The left side of her neck and under her ear there is an area the size of a half dollar that appears to be a lump that feels like it is swelling up. Patient is not eating anything different and has never had an allergic reaction. Pt denies difficuty swollowing, denies tongue swelling or SOB.  Patient was advised to go to urgent care this morning by Colletta Maryland, clinic supervisor and patient declined and is now being scheduled for next available appointment.

## 2021-04-05 NOTE — Telephone Encounter (Signed)
Thank you. I will try to give her a call alter on today or tomorrow.

## 2021-04-06 ENCOUNTER — Ambulatory Visit (INDEPENDENT_AMBULATORY_CARE_PROVIDER_SITE_OTHER): Payer: BC Managed Care – PPO | Admitting: Nurse Practitioner

## 2021-04-06 ENCOUNTER — Encounter: Payer: Self-pay | Admitting: Nurse Practitioner

## 2021-04-06 ENCOUNTER — Other Ambulatory Visit: Payer: Self-pay

## 2021-04-06 VITALS — BP 104/72 | HR 71 | Temp 98.7°F | Ht 67.0 in | Wt 197.1 lb

## 2021-04-06 DIAGNOSIS — R0982 Postnasal drip: Secondary | ICD-10-CM | POA: Diagnosis not present

## 2021-04-06 DIAGNOSIS — R590 Localized enlarged lymph nodes: Secondary | ICD-10-CM | POA: Insufficient documentation

## 2021-04-06 NOTE — Progress Notes (Signed)
Acute Office Visit  Subjective:    Patient ID: Catherine Cunningham, female    DOB: 29-Mar-1966, 55 y.o.   MRN: [id number]  Chief Complaint  Patient presents with  . Neck Pain    HPI Patient is in today for evaluation of swelling of left side of her neck. She states that yesterday, while eating, she started to note some lymph node enlargement on the left side of the neck. Was minimally tender with palpation. Did not interfere with her ability to breathe or swallow. She denies ear pain, sore throat, excess post nasal drip, or headache. She states that swelling is improved today. She states you can feel the swelling just under her left ear lobe. She states that eating seems to make it worse, but not eating anything in particular.  She denies fever, chills, or body aches. She denies nausea, vomiting, or upset stomach.   Past Medical History:  Diagnosis Date  . Depression    Phreesia 02/19/2021    Past Surgical History:  Procedure Laterality Date  . BREAST BIOPSY Left 2014   neg- Ribbon clip  . BREAST BIOPSY Left 10/24/2018   Affirm Bx- Coil Clip- path pending  . INJECTION ANESTHETIC AGENT TO CERVICAL PLEXUS      Family History  Problem Relation Age of Onset  . Breast cancer Mother 33  . Hodgkin's lymphoma Father   . Anuerysm Father   . Diabetes Mellitus I Brother     Social History   Socioeconomic History  . Marital status: Married    Spouse name: Not on file  . Number of children: Not on file  . Years of education: Not on file  . Highest education level: Not on file  Occupational History  . Not on file  Tobacco Use  . Smoking status: Current Some Day Smoker    Types: Cigarettes  . Smokeless tobacco: Never Used  . Tobacco comment: smokes when she drinks  Substance and Sexual Activity  . Alcohol use: Yes    Alcohol/week: 14.0 standard drinks    Types: 7 Glasses of wine, 7 Cans of beer per week    Comment: one a day wine or beer  . Drug use: Never  . Sexual  activity: Not on file  Other Topics Concern  . Not on file  Social History Narrative  . Not on file   Social Determinants of Health   Financial Resource Strain: Not on file  Food Insecurity: Not on file  Transportation Needs: Not on file  Physical Activity: Not on file  Stress: Not on file  Social Connections: Not on file  Intimate Partner Violence: Not on file    Outpatient Medications Prior to Visit  Medication Sig Dispense Refill  . buPROPion (WELLBUTRIN XL) 300 MG 24 hr tablet Take 1 tablet (300 mg total) by mouth daily. 90 tablet 1  . FLUoxetine (PROZAC) 20 MG capsule Take 1 capsule (20 mg total) by mouth daily. 90 capsule 1   No facility-administered medications prior to visit.    No Known Allergies  Review of Systems  Constitutional: Negative for activity change, chills, fatigue and fever.  HENT: Negative for congestion, postnasal drip, rhinorrhea, sinus pressure, sinus pain and sore throat.   Eyes: Negative.   Respiratory: Negative for cough, shortness of breath and wheezing.   Cardiovascular: Negative for chest pain and palpitations.  Gastrointestinal: Negative for constipation, nausea and vomiting.  Endocrine: Negative.   Musculoskeletal: Positive for neck pain. Negative for arthralgias, back pain and  myalgias.       States that she does have some chronic neck pain and tenderness.   Skin: Negative for rash.  Neurological: Negative for dizziness, weakness and headaches.  Hematological: Positive for adenopathy.  Psychiatric/Behavioral: The patient is nervous/anxious.   All other systems reviewed and are negative.      Objective:    Physical Exam Vitals and nursing note reviewed.  Constitutional:      Appearance: Normal appearance. She is well-developed.  HENT:     Head: Normocephalic and atraumatic.     Right Ear: Tympanic membrane is erythematous and bulging.     Left Ear: Tympanic membrane is erythematous and bulging.     Mouth/Throat:     Comments:  Post nasal drip is evident.  Eyes:     Pupils: Pupils are equal, round, and reactive to light.  Neck:     Comments: Very mild, non-tender, preauricular and submandibular lymphadenopathy. No redness, warmth, or evidence of infection noted.  Cardiovascular:     Rate and Rhythm: Normal rate and regular rhythm.     Pulses: Normal pulses.     Heart sounds: Normal heart sounds.  Pulmonary:     Effort: Pulmonary effort is normal.     Breath sounds: Normal breath sounds.  Musculoskeletal:        General: Normal range of motion.     Cervical back: Normal range of motion and neck supple.  Lymphadenopathy:     Cervical: Cervical adenopathy present.  Skin:    General: Skin is warm and dry.  Neurological:     General: No focal deficit present.     Mental Status: She is alert and oriented to person, place, and time.  Psychiatric:        Mood and Affect: Mood normal.        Behavior: Behavior normal.        Thought Content: Thought content normal.        Judgment: Judgment normal.     Today's Vitals   04/06/21 0820  BP: 104/72  Pulse: 71  Temp: 98.7 F (37.1 C)  SpO2: 98%  Weight: 197 lb 1.6 oz (89.4 kg)  Height: 5\' 7"  (1.702 m)   Body mass index is 30.87 kg/m.   Wt Readings from Last 3 Encounters:  04/06/21 197 lb 1.6 oz (89.4 kg)  02/22/21 198 lb 1.6 oz (89.9 kg)  10/02/20 194 lb 3.4 oz (88.1 kg)    Health Maintenance Due  Topic Date Due  . HIV Screening  Never done  . TETANUS/TDAP  Never done  . PAP SMEAR-Modifier  Never done    There are no preventive care reminders to display for this patient.   Lab Results  Component Value Date   TSH 2.120 10/23/2018   Lab Results  Component Value Date   WBC 5.3 10/23/2018   HGB 15.1 10/23/2018   HCT 44.2 10/23/2018   MCV 86 10/23/2018   PLT 375 10/23/2018   Lab Results  Component Value Date   NA 142 10/23/2018   K 5.1 10/23/2018   CO2 24 10/23/2018   GLUCOSE 101 (H) 10/23/2018   BUN 19 10/23/2018   CREATININE 0.95  10/23/2018   BILITOT 0.6 10/23/2018   ALKPHOS 85 10/23/2018   AST 18 10/23/2018   ALT 18 10/23/2018   PROT 7.1 10/23/2018   ALBUMIN 4.9 10/23/2018   CALCIUM 10.2 10/23/2018   Lab Results  Component Value Date   CHOL 234 (H) 10/23/2018   Lab  Results  Component Value Date   HDL 85 10/23/2018   Lab Results  Component Value Date   LDLCALC 132 (H) 10/23/2018   Lab Results  Component Value Date   TRIG 85 10/23/2018   Lab Results  Component Value Date   CHOLHDL 2.8 10/23/2018   No results found for: HGBA1C     Assessment & Plan:  1. Preauricular lymphadenopathy Very mild adenopathy noted. Non tender. Per patient, improving. Advised her to monitor this closely. Notify office if swelling gets worse or tender. Possibly related to allergic rhinitis.   2. Post-nasal drip Possibly contributing to lymph node enlargement. Encouraged her to take OTC claritin every day to see if this improves. She voiced understanding and agreement with the plan.   Problem List Items Addressed This Visit      Immune and Lymphatic   Preauricular lymphadenopathy - Primary     Other   Post-nasal drip     Time spent with the patient was approximately 22  minutes. This time included reviewing progress notes, labs, imaging studies, and discussing plan for follow up.    Ronnell Freshwater, NP

## 2021-04-06 NOTE — Patient Instructions (Signed)
Lymphangitis, Adult  Lymphangitis is inflammation of one or more lymph vessels. This condition is usually caused by an infection with bacteria. The lymphatic system is part of the body's defense system (immune system). It is a network of vessels, glands, and organs that carry fluid (lymph) and other substances around the body. Lymph vessels drain into glands called lymph nodes. These nodes remove bacteria, viruses, and waste products from lymph to keep them from spreading through the body. Lymphangitis causes red streaks, swelling, and skin soreness in the area of the affected lymph vessels. Starting treatment right away is important because this condition can quickly get worse and lead to serious illness. It can spread quickly through your lymph system and into your blood (bacteremia). What are the causes? This condition is usually caused by a bacterial infection of the skin. The bacteria may enter the body through an injury to the skin, such as a cut, scratch, surgical incision, or insect bite. Lymphangitis usually results from an infection with streptococcus or staphylococcus bacteria, but it may also be caused by other infections. What increases the risk? The following factors may make you more likely to develop this condition:  Being female. Men are more likely to get lymphangitis caused by cellulitis.  Having a decreased ability to fight infection or a weakened immune system.  Having diabetes.  Taking drugs that suppress the immune system.  Having chickenpox.  Being weak from another illness. What are the signs or symptoms? The most common symptom of lymphangitis is a wound or skin infection that develops red streaks in the skin. These are the infected lymph vessels. The red streaks will extend toward the lymph nodes that drain the vessels.  Other symptoms may include:  Warmth and tenderness over the streaks.  Throbbing pain.  Swollen and tender lymph nodes. ? For arm infections,  these will be under the arm. ? For leg infections, these will be in the groin area.  Fever.  Chills.  Headache.  Appetite loss.  Muscle aches.  Fast pulse. How is this diagnosed? This condition may be diagnosed based on your symptoms and a physical exam. You may also have tests, such as:  Blood tests to check for an increase in white blood cells.  Blood cultures to look for bacteremia.  Culture and sensitivity testing. This is a test to find out what type of bacteria will grow from a sample of pus swabbed from the wound or skin infection. The results help determine which antibiotic medicines will kill the bacteria.  X-rays. These may be needed if you have a red or swollen joint. In this case, you may also be referred to a bone specialist. How is this treated? Treatment for this condition may include:  Antibiotics. ? You may be started on an antibiotic that is known to kill both streptococcus and staphylococcus bacteria. ? Your antibiotics may need to be switched if tests show that your condition is caused by another type of bacteria. ? If your infection is very bad or has spread to another area of your body, you may need to get antibiotics given directly into a vein through an IV at the hospital.  Pain medicine.  Incision and drainage. This is a procedure that may be done at the hospital if pus needs to be drained from your wound. Follow these instructions at home:  Take over-the-counter and prescription medicines only as told by your health care provider.  Take your antibiotic medicine as told by your health care provider.  Do not stop taking the antibiotic even if you start to feel better.  Rest at home until your health care provider says that you can return to your normal activities.  Drink enough fluid to keep your urine pale yellow.  Follow instructions from your health care provider about how to take care of any wound.  Raise (elevate) the affected area above the  level of your heart while you are sitting or lying down.  Keep all follow-up visits as told by your health care provider. This is important. Contact a health care provider if:  You have chills or a fever.  Your symptoms do not go away or they get worse with treatment.  Your symptoms come back after treatment. Get help right away if you have:  A headache or a stiff neck.  Chest pain.  Trouble breathing. Summary  Lymphangitis is inflammation of one or more lymph vessels. It is usually caused by a bacterial infection.  Take your antibiotic medicine as told by your health care provider. Do not stop taking the antibiotic even if you start to feel better.  Rest and drink plenty of fluids.  Keep all follow-up visits as told by your health care provider. This is important. This information is not intended to replace advice given to you by your health care provider. Make sure you discuss any questions you have with your health care provider. Document Revised: 09/17/2018 Document Reviewed: 09/17/2018 Elsevier Patient Education  2021 [employer].

## 2021-04-19 ENCOUNTER — Encounter: Payer: BC Managed Care – PPO | Admitting: Nurse Practitioner

## 2021-05-10 ENCOUNTER — Encounter: Payer: BC Managed Care – PPO | Admitting: Nurse Practitioner

## 2021-05-11 ENCOUNTER — Encounter: Payer: Self-pay | Admitting: Nurse Practitioner

## 2021-05-13 DIAGNOSIS — R195 Other fecal abnormalities: Secondary | ICD-10-CM | POA: Diagnosis not present

## 2021-05-13 DIAGNOSIS — Z1211 Encounter for screening for malignant neoplasm of colon: Secondary | ICD-10-CM | POA: Diagnosis not present

## 2021-05-13 DIAGNOSIS — E669 Obesity, unspecified: Secondary | ICD-10-CM | POA: Diagnosis not present

## 2021-05-18 ENCOUNTER — Telehealth: Payer: Self-pay | Admitting: Nurse Practitioner

## 2021-05-18 NOTE — Telephone Encounter (Signed)
Patient left message notifying them.

## 2021-05-18 NOTE — Telephone Encounter (Signed)
Colorguard results are needed for the patient. Please advise, thanks. [phone] is fax and can be given to me to fax. Please advise, thanks.

## 2021-05-18 NOTE — Telephone Encounter (Signed)
When the result come in for the cologuard she will be informed.   thanks

## 2021-05-18 NOTE — Telephone Encounter (Signed)
We have not received Cologuard results at this time. Please advise patient of this. AS,CMA

## 2021-05-20 ENCOUNTER — Encounter: Payer: Self-pay | Admitting: Nurse Practitioner

## 2021-05-20 LAB — COLOGUARD: Cologuard: POSITIVE — AB

## 2021-05-20 NOTE — Telephone Encounter (Signed)
Attempted to contact [employer] rep but her phone number has been disconnected. Sending email to attempt contact. AS, CMA

## 2021-05-20 NOTE — Telephone Encounter (Signed)
Cologuard result has been obtain, faxed to [facility] and scanned under abstraction. AS, CMA

## 2021-05-20 NOTE — Telephone Encounter (Signed)
She did cologuard back in march and had positive results. I called and spoke to her myself. Going back through the visits, it is documented that I called and spoke to her about the results and we referred her to GI. This was back 03/15/2021 and I made referral to Dr. Mickel Duhamel. The referral is in there, but I don't know if if ever went through.

## 2021-05-20 NOTE — Progress Notes (Signed)
Here are her positive cologuard results.

## 2021-05-20 NOTE — Telephone Encounter (Signed)
I do not. I'm pretty sure I gave it back to whoever showed it to me. Can we call [employer] to have them send [place] another one. Thanks. And sorry.

## 2021-05-20 NOTE — Telephone Encounter (Signed)
I am able to see the telephone encounter where she was notified of the positive cologuard but we do not have the actual test that needed to be scanned for documentation/ proof of positive. Do you still have the form?

## 2021-05-20 NOTE — Telephone Encounter (Signed)
Recruitment consultant and they are faxing the positive result to our office. AS< CMA

## 2021-05-21 ENCOUNTER — Encounter: Payer: Self-pay | Admitting: Nurse Practitioner

## 2021-05-21 ENCOUNTER — Telehealth: Payer: Self-pay | Admitting: Nurse Practitioner

## 2021-05-21 NOTE — Telephone Encounter (Signed)
Patient left a voicemail stating her colorguard results just came I and she needs them faxed to Dr Lorie Apley office. She did not leave a fax number. Phone number is [phone] for his office. Thanks   She requested them to be faxed today.

## 2021-05-21 NOTE — Telephone Encounter (Signed)
This has been addressed. AS, CMA

## 2021-06-29 DIAGNOSIS — Z1211 Encounter for screening for malignant neoplasm of colon: Secondary | ICD-10-CM | POA: Diagnosis not present

## 2021-06-29 DIAGNOSIS — E669 Obesity, unspecified: Secondary | ICD-10-CM | POA: Diagnosis not present

## 2021-06-29 DIAGNOSIS — R195 Other fecal abnormalities: Secondary | ICD-10-CM | POA: Diagnosis not present

## 2021-07-09 DIAGNOSIS — Z1211 Encounter for screening for malignant neoplasm of colon: Secondary | ICD-10-CM | POA: Diagnosis not present

## 2021-07-09 DIAGNOSIS — R195 Other fecal abnormalities: Secondary | ICD-10-CM | POA: Diagnosis not present

## 2021-11-08 DIAGNOSIS — U071 COVID-19: Secondary | ICD-10-CM | POA: Diagnosis not present

## 2022-03-06 ENCOUNTER — Other Ambulatory Visit: Payer: Self-pay | Admitting: Nurse Practitioner

## 2022-03-06 DIAGNOSIS — F323 Major depressive disorder, single episode, severe with psychotic features: Secondary | ICD-10-CM

## 2022-03-24 ENCOUNTER — Other Ambulatory Visit: Payer: Self-pay | Admitting: Family Medicine

## 2022-03-24 DIAGNOSIS — Z1231 Encounter for screening mammogram for malignant neoplasm of breast: Secondary | ICD-10-CM

## 2022-03-30 ENCOUNTER — Ambulatory Visit: Payer: BC Managed Care – PPO

## 2022-04-01 ENCOUNTER — Ambulatory Visit: Payer: BC Managed Care – PPO

## 2022-04-01 ENCOUNTER — Ambulatory Visit
Admission: RE | Admit: 2022-04-01 | Discharge: 2022-04-01 | Disposition: A | Discharge status: Home / Self Care | Payer: No Typology Code available for payment source | Source: Ambulatory Visit | Attending: Family Medicine | Admitting: Family Medicine

## 2022-04-01 DIAGNOSIS — Z1231 Encounter for screening mammogram for malignant neoplasm of breast: Secondary | ICD-10-CM

## 2022-04-09 LAB — LAB REPORT - SCANNED
A1c: 5.5
EGFR: 84
HM HIV Screening: NEGATIVE

## 2022-04-13 ENCOUNTER — Other Ambulatory Visit (HOSPITAL_BASED_OUTPATIENT_CLINIC_OR_DEPARTMENT_OTHER): Payer: Self-pay

## 2022-04-13 MED ORDER — BUPROPION HCL ER (XL) 300 MG PO TB24
ORAL_TABLET | ORAL | 6 refills | Status: DC
  Filled 2022-04-13: qty 30, 30d supply, fill #0
  Filled 2023-04-09: qty 30, 30d supply, fill #1

## 2022-04-28 ENCOUNTER — Other Ambulatory Visit (HOSPITAL_BASED_OUTPATIENT_CLINIC_OR_DEPARTMENT_OTHER): Payer: Self-pay

## 2022-04-28 MED ORDER — FLUOXETINE HCL 20 MG PO CAPS
20.0000 mg | ORAL_CAPSULE | Freq: Every day | ORAL | 3 refills | Status: DC
  Filled 2022-04-28: qty 90, 90d supply, fill #0

## 2022-04-29 ENCOUNTER — Other Ambulatory Visit (HOSPITAL_BASED_OUTPATIENT_CLINIC_OR_DEPARTMENT_OTHER): Payer: Self-pay

## 2022-04-29 MED ORDER — SERTRALINE HCL 100 MG PO TABS
100.0000 mg | ORAL_TABLET | Freq: Every morning | ORAL | 0 refills | Status: DC
  Filled 2022-04-29: qty 90, 90d supply, fill #0

## 2022-04-29 MED ORDER — BUPROPION HCL ER (XL) 300 MG PO TB24
300.0000 mg | ORAL_TABLET | Freq: Every day | ORAL | 1 refills | Status: DC
  Filled 2022-04-29: qty 90, 90d supply, fill #0

## 2022-05-13 LAB — CBC AND DIFFERENTIAL
HCT: 43 (ref 36–46)
Hemoglobin: 14.6 (ref 12.0–16.0)
Platelets: 328 10*3/uL (ref 150–400)
WBC: 4.5

## 2022-05-13 LAB — LIPID PANEL
Cholesterol: 212 — AB (ref 0–200)
HDL: 69 (ref 35–70)
LDL Cholesterol: 143
LDl/HDL Ratio: 1.8
Triglycerides: 79 (ref 40–160)

## 2022-05-13 LAB — BASIC METABOLIC PANEL
BUN: 22 — AB (ref 4–21)
CO2: 26 — AB (ref 13–22)
Chloride: 105 (ref 99–108)
Creatinine: 1 (ref 0.5–1.1)
Glucose: 98
Potassium: 4.6 mEq/L (ref 3.5–5.1)
Sodium: 141 (ref 137–147)

## 2022-05-13 LAB — TSH: TSH: 2.29 (ref 0.41–5.90)

## 2022-05-13 LAB — HIV ANTIBODY (ROUTINE TESTING W REFLEX): HIV 1&2 Ab, 4th Generation: NONREACTIVE

## 2022-05-13 LAB — COMPREHENSIVE METABOLIC PANEL
Albumin: 4.6 (ref 3.5–5.0)
Calcium: 9.9 (ref 8.7–10.7)
eGFR: 63

## 2022-05-13 LAB — CBC: RBC: 4.9 (ref 3.87–5.11)

## 2022-05-16 LAB — HM COLONOSCOPY

## 2022-05-16 LAB — HM PAP SMEAR: HM Pap smear: NEGATIVE

## 2022-06-09 ENCOUNTER — Other Ambulatory Visit (HOSPITAL_BASED_OUTPATIENT_CLINIC_OR_DEPARTMENT_OTHER): Payer: Self-pay

## 2022-06-09 MED ORDER — BUPROPION HCL ER (XL) 300 MG PO TB24
ORAL_TABLET | ORAL | 3 refills | Status: DC
  Filled 2022-06-09: qty 90, 90d supply, fill #0
  Filled 2022-09-30: qty 90, 90d supply, fill #1
  Filled 2022-12-12 – 2023-01-06 (×3): qty 90, 90d supply, fill #2
  Filled 2023-04-09 – 2023-04-19 (×2): qty 90, 90d supply, fill #3

## 2022-06-09 MED ORDER — TRIAMCINOLONE ACETONIDE 0.5 % EX CREA
TOPICAL_CREAM | CUTANEOUS | 2 refills | Status: DC
  Filled 2022-06-09: qty 15, 7d supply, fill #0

## 2022-06-09 MED ORDER — SERTRALINE HCL 100 MG PO TABS
ORAL_TABLET | ORAL | 3 refills | Status: DC
  Filled 2022-06-09 – 2022-08-01 (×2): qty 90, 90d supply, fill #0
  Filled 2022-11-15: qty 90, 90d supply, fill #1
  Filled 2023-03-01: qty 90, 90d supply, fill #2
  Filled 2023-04-09: qty 90, 90d supply, fill #3

## 2022-07-07 IMAGING — MG DIGITAL SCREENING BILAT W/ CAD
4 series · 4 of 4 positions shown · non-contrast
Comparison: Previous exam(s).

CLINICAL DATA: Screening.

EXAM:
DIGITAL SCREENING BILATERAL MAMMOGRAM WITH CAD
TECHNIQUE: Bilateral screening digital craniocaudal and mediolateral oblique
mammograms were obtained. The images were evaluated with
computer-aided detection.

[R CC]
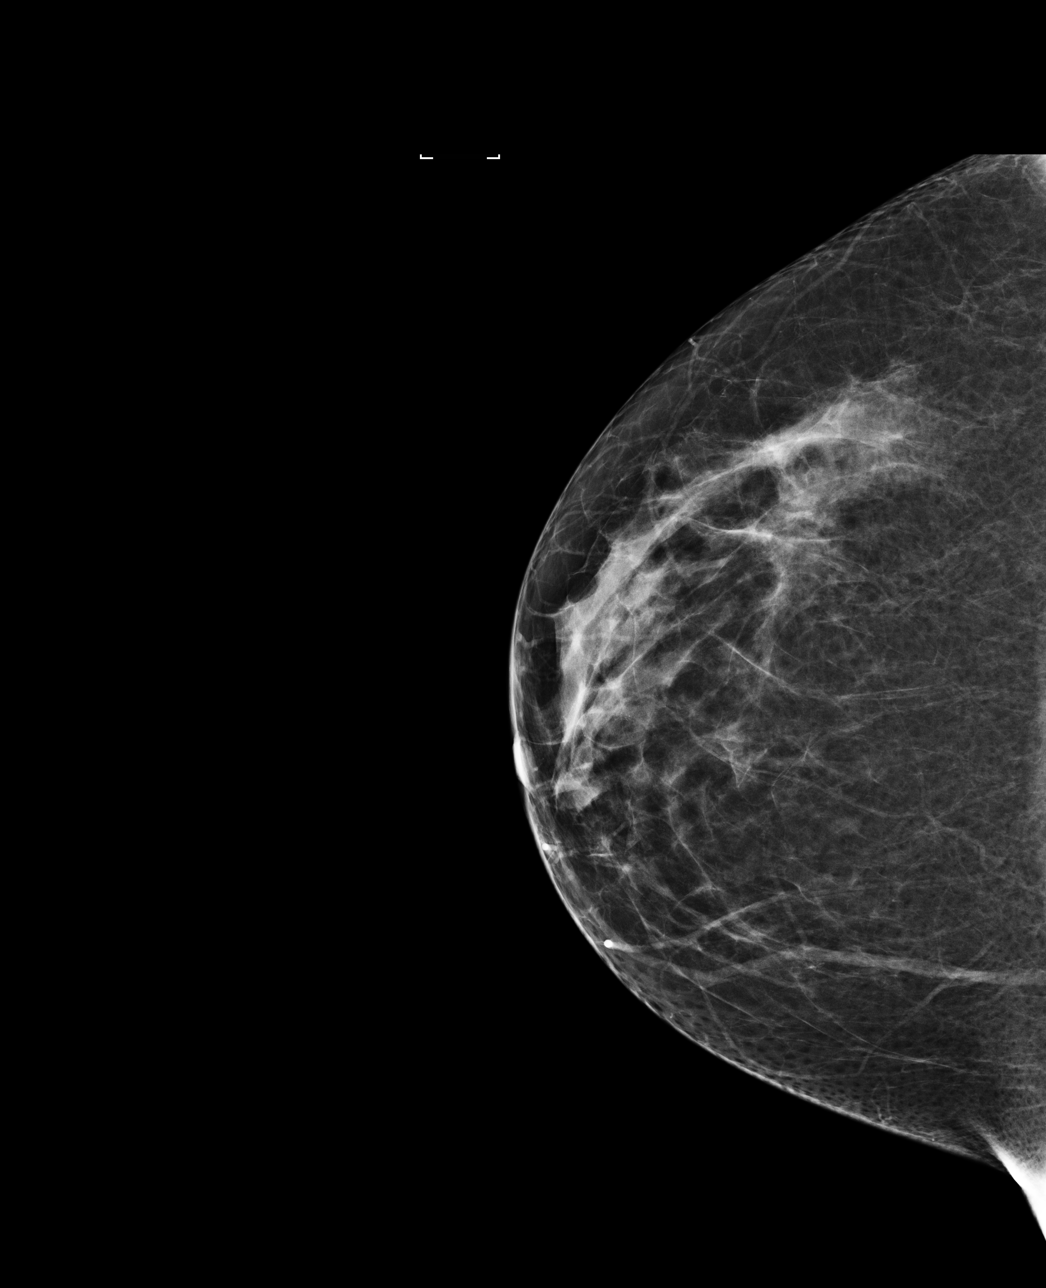

[L MLO]
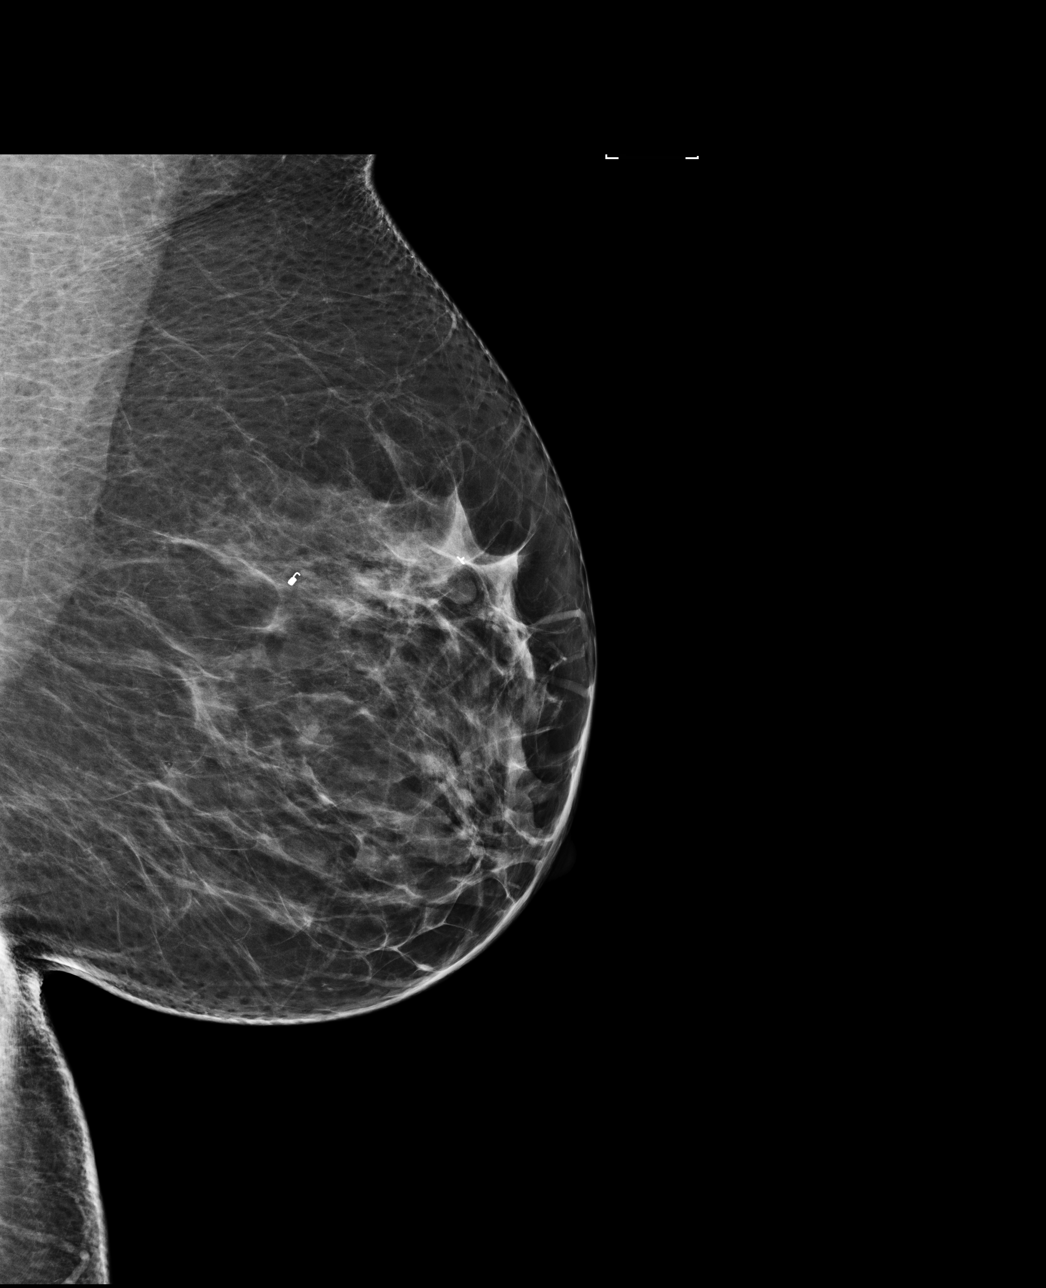

[L CC]
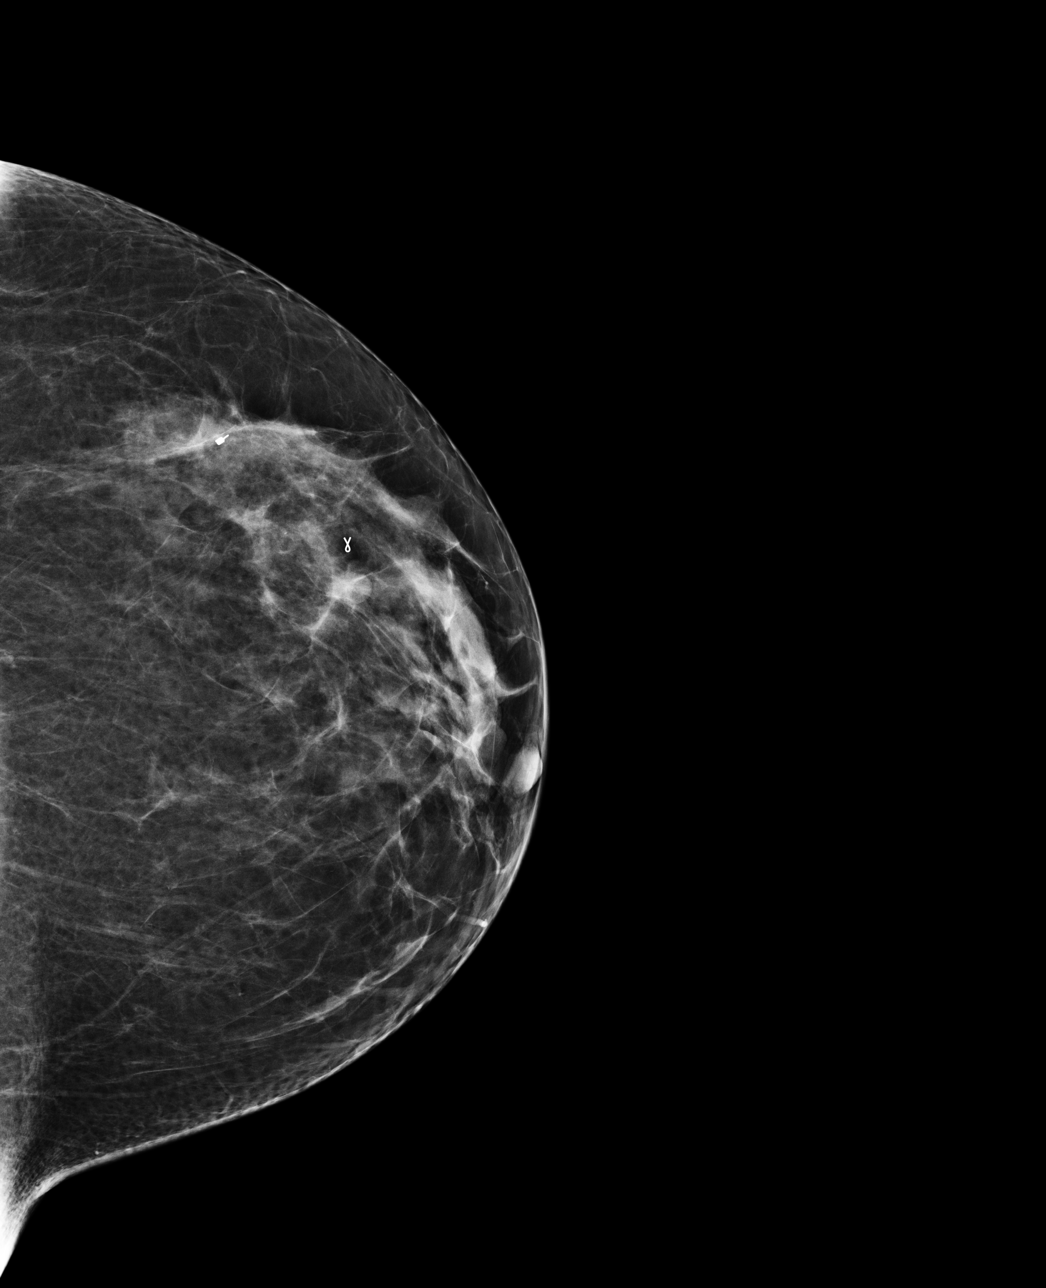

[R MLO]
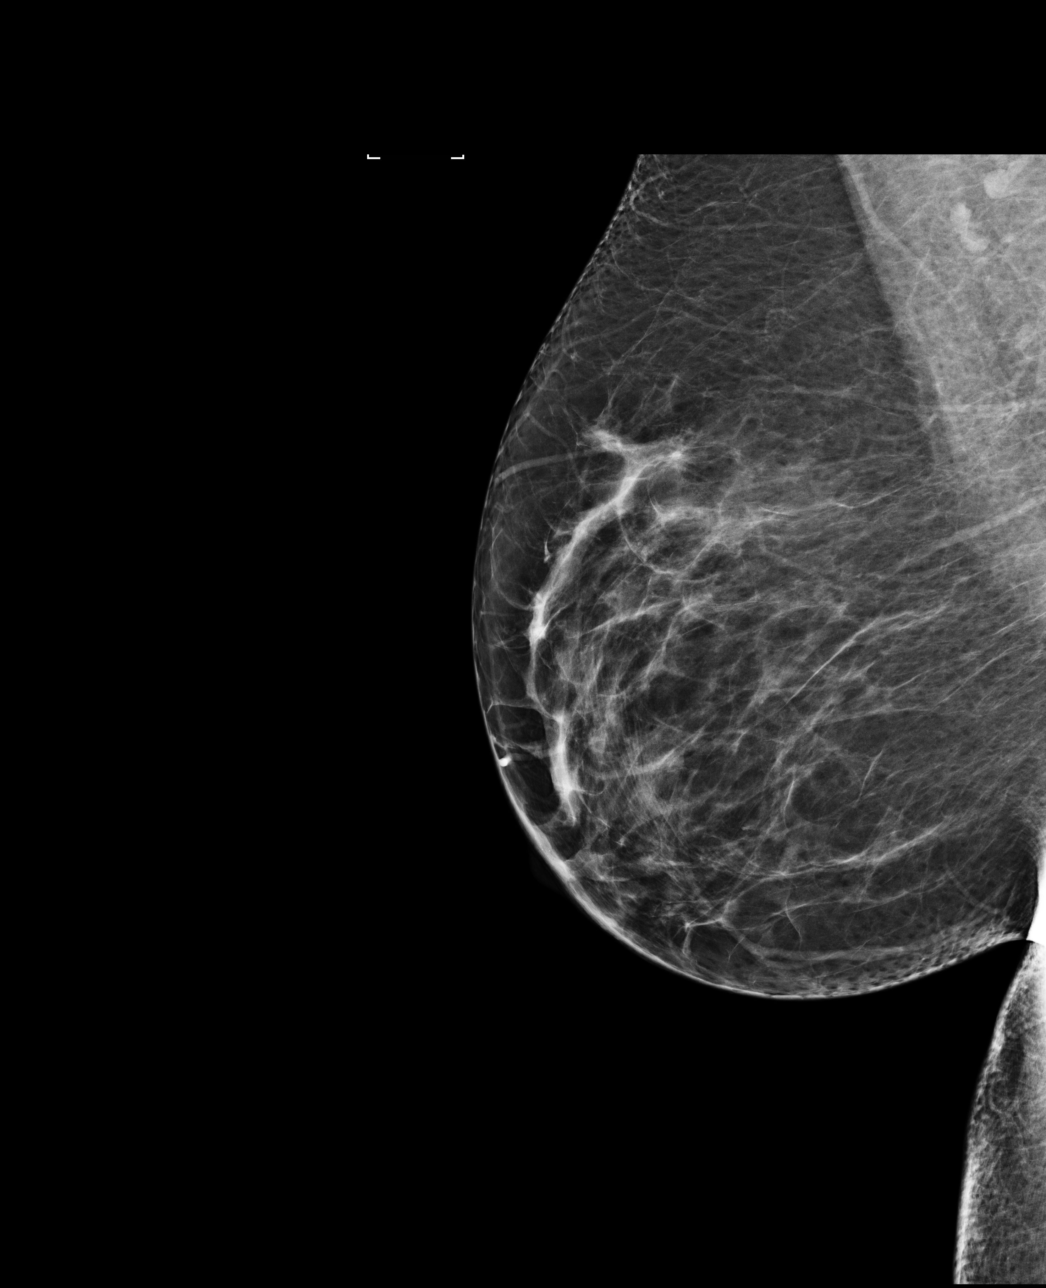

[4 of 4 positions shown; findings below may reference images not displayed]

ACR Breast Density Category c: The breast tissue is heterogeneously
dense, which may obscure small masses.
FINDINGS: There are no findings suspicious for malignancy.
IMPRESSION: No mammographic evidence of malignancy. A result letter of this
screening mammogram will be mailed directly to the patient.

RECOMMENDATION:
Screening mammogram in one year. (Code:TJ-B-ZPA)

BI-RADS CATEGORY  1: Negative.

## 2022-07-14 ENCOUNTER — Encounter: Payer: Self-pay | Admitting: Physical Medicine and Rehabilitation

## 2022-07-14 ENCOUNTER — Ambulatory Visit (INDEPENDENT_AMBULATORY_CARE_PROVIDER_SITE_OTHER): Payer: No Typology Code available for payment source

## 2022-07-14 ENCOUNTER — Ambulatory Visit (INDEPENDENT_AMBULATORY_CARE_PROVIDER_SITE_OTHER): Payer: No Typology Code available for payment source | Admitting: Physical Medicine and Rehabilitation

## 2022-07-14 VITALS — BP 116/70 | HR 103

## 2022-07-14 DIAGNOSIS — M542 Cervicalgia: Secondary | ICD-10-CM

## 2022-07-14 DIAGNOSIS — M25511 Pain in right shoulder: Secondary | ICD-10-CM | POA: Diagnosis not present

## 2022-07-14 DIAGNOSIS — G8929 Other chronic pain: Secondary | ICD-10-CM

## 2022-07-14 DIAGNOSIS — M5412 Radiculopathy, cervical region: Secondary | ICD-10-CM | POA: Diagnosis not present

## 2022-07-14 DIAGNOSIS — R202 Paresthesia of skin: Secondary | ICD-10-CM | POA: Diagnosis not present

## 2022-07-14 DIAGNOSIS — M7918 Myalgia, other site: Secondary | ICD-10-CM

## 2022-07-14 NOTE — Progress Notes (Signed)
Catherine Cunningham - 55 y.o. female MRN [id number]  Date of birth: [birth year]  Office Visit Note: Visit Date: 07/14/2022 PCP: Ronnell Freshwater, NP Referred by: Ronnell Freshwater, NP  Subjective: Chief Complaint  Patient presents with   Neck - Pain   Right Arm - Pain   HPI: Catherine Cunningham is a 56 y.o. female who comes in today as a self referral for evaluation of chronic, worsening and severe right sided neck pain radiating to shoulder and right upper arm. Patient reports pain has been ongoing for approximately 10 years. Her pain is exacerbated by movement and activity, she describes pain as a dull, aching and sore sensation, currently rates as 2 out of 10 at present. Patient reports some relief of pain with home exercise regimen, use of TENS unit, and over the counter medications. Patient reports history of chiropractic treatment, however she can't recall that these treatments helped to alleviate her pain substantially. Patient reports she did have cervical MRI completed around 2015, however we are unable to review imaging or report from this study. Patient states she did undergo cervical trigger point injections and cervical epidural steroid injection many years ago while living in [place], states epidural steroid injection significantly helped to alleviate her pain. We are not able to see records from previous treatments at this time. Patient states her pain is more intermittent, states she has good and bad days, has learned to manage pain over the course of the last 10 years. 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 States she had an episode recently where she looked down and had a difficult time extending her neck back up due to pain. Patient reports her pain can be very aggravating and would like to discuss treatment options. Patient denies focal weakness, numbness and tingling. Patient denies recent trauma  or falls.    Review of Systems  Musculoskeletal:  Positive for myalgias and neck pain.  Neurological:  Negative for tingling, sensory change, focal weakness and weakness.  All other systems reviewed and are negative.  Otherwise per HPI.  Assessment & Plan: Visit Diagnoses:    ICD-10-CM   1. Neck pain  M54.2 XR Cervical Spine 2 or 3 views    2. Cervical radiculopathy  M54.12     3. Chronic right shoulder pain  M25.511    G89.29     4. Paresthesia of skin  R20.2     5. Myofascial pain syndrome  M79.18        Plan: Findings:  Chronic, worsening and severe right sided neck pain radiating to shoulder and right upper arm. Patient continues to have severe pain despite good conservative therapies such as formal chiropractic treatments, use of TENS unit, rest and over the counter medications. Patients clinical presentation and exam are consistent with C5 nerve pattern, however there is also a myofascial component contributing to her pain as she does have tenderness and palpable trigger points to right trapezius region. We believe the next step is to place order for formal physical therapy with our in house team. I do think patient could benefit from dry needling and manual treatments. I also placed an order for cervical MRI imaging to further assess for any type of nerve irritation/stenosis. I would like patient to follow up after cervical MRI imaging is complete for review and to discuss options. I did discuss possibility of perform cervical epidural steroid injection if  warranted, patient is agreeable. I explained cervical injection procedure to patient in detail, she has no questions at this time. Patient encouraged to try and obtain medical records and cervical MRI report from when she was treated in [place]. No red flag symptoms noted upon exam today.     Meds & Orders: No orders of the defined types were placed in this encounter.   Orders Placed This Encounter  Procedures   XR Cervical  Spine 2 or 3 views    Follow-up: Return for follow up after cervical MRI imaging is obtained.   Procedures: No procedures performed      Clinical History: No specialty comments available.   She reports that she has been smoking cigarettes. She has never used smokeless tobacco. No results for input(s): "HGBA1C", "LABURIC" in the last 8760 hours.  Objective:  VS:  HT:    WT:   BMI:     BP:116/70  HR:(!) 103bpm  TEMP: ( )  RESP:  Physical Exam Vitals and nursing note reviewed.  HENT:     Head: Normocephalic and atraumatic.     Right Ear: External ear normal.     Left Ear: External ear normal.     Nose: Nose normal.     Mouth/Throat:     Mouth: Mucous membranes are moist.  Eyes:     Extraocular Movements: Extraocular movements intact.  Cardiovascular:     Rate and Rhythm: Normal rate.     Pulses: Normal pulses.  Pulmonary:     Effort: Pulmonary effort is normal.  Abdominal:     General: Abdomen is flat. There is no distension.  Musculoskeletal:        General: Tenderness present.     Cervical back: Tenderness present.     Comments: Discomfort noted with flexion, extension and side-to-side rotation. Patient has good strength in the upper extremities including 5 out of 5 strength in wrist extension, long finger flexion and APB.  There is no atrophy of the hands intrinsically. Dysesthesias noted to right C5 dermatome. Sensation intact bilaterally. Multiple palpable trigger points noted to right trapezius region. Negative Hoffman's sign.   Skin:    General: Skin is warm and dry.     Capillary Refill: Capillary refill takes less than 2 seconds.  Neurological:     General: No focal deficit present.     Mental Status: She is alert and oriented to person, place, and time.  Psychiatric:        Mood and Affect: Mood normal.        Behavior: Behavior normal.     Ortho Exam  Imaging: No results found.  Past Medical/Family/Surgical/Social History: Medications & Allergies  reviewed per EMR, new medications updated. Patient Active Problem List   Diagnosis Date Noted   Preauricular lymphadenopathy 04/06/2021   Post-nasal drip 04/06/2021   Encounter to establish care 02/22/2021   Bilateral impacted cerumen 02/22/2021   Healthcare maintenance 02/22/2021   Screening for colon cancer 02/22/2021   Pain in joint of left knee 12/02/2020   Other fatigue 10/18/2020   Encounter for screening mammogram for malignant neoplasm of breast 10/18/2020   Vitamin B 12 deficiency 08/01/2018   Vitamin D deficiency 08/01/2018   Major depressive disorder, single episode with psychotic features ([place]) 08/01/2018   Encounter for general adult medical examination with abnormal findings 08/01/2018   Past Medical History:  Diagnosis Date   Depression    [employer] 02/19/2021   Family History  Problem Relation Age of Onset  Breast cancer Mother 27   Hodgkin's lymphoma Father    Anuerysm Father    Diabetes Mellitus I Brother    Past Surgical History:  Procedure Laterality Date   BREAST BIOPSY Left 2014   neg- Ribbon clip   BREAST BIOPSY Left 10/24/2018   Affirm Bx- Coil Clip- benign   INJECTION ANESTHETIC AGENT TO CERVICAL PLEXUS     Social History   Occupational History   Not on file  Tobacco Use   Smoking status: Some Days    Types: Cigarettes   Smokeless tobacco: Never   Tobacco comments:    smokes when she drinks  Substance and Sexual Activity   Alcohol use: Yes    Alcohol/week: 14.0 standard drinks of alcohol    Types: 7 Glasses of wine, 7 Cans of beer per week    Comment: one a day wine or beer   Drug use: Never   Sexual activity: Not on file

## 2022-07-14 NOTE — Progress Notes (Signed)
Pt state neck pain that travels down her right arm. Pt state lifting her right arm makes the pain worse. Pt state uses ice and take over the counter pain meds to help ease her pain.  Numeric Pain Rating Scale and Functional Assessment Average Pain 9 Pain Right Now 2 My pain is constant, sharp, dull, stabbing, tingling, and aching Pain is worse with: some activites Pain improves with: heat/ice, medication, and injections   In the last MONTH (on 0-10 scale) has pain interfered with the following?  1. General activity like being  able to carry out your everyday physical activities such as walking, climbing stairs, carrying groceries, or moving a chair?  Rating(5)  2. Relation with others like being able to carry out your usual social activities and roles such as  activities at home, at work and in your community. Rating(6)  3. Enjoyment of life such that you have  been bothered by emotional problems such as feeling anxious, depressed or irritable?  Rating(7)

## 2022-08-01 ENCOUNTER — Other Ambulatory Visit (HOSPITAL_BASED_OUTPATIENT_CLINIC_OR_DEPARTMENT_OTHER): Payer: Self-pay

## 2022-08-03 ENCOUNTER — Telehealth: Payer: Self-pay | Admitting: Physical Medicine and Rehabilitation

## 2022-08-03 ENCOUNTER — Other Ambulatory Visit: Payer: Self-pay | Admitting: Physical Medicine and Rehabilitation

## 2022-08-03 DIAGNOSIS — M5412 Radiculopathy, cervical region: Secondary | ICD-10-CM

## 2022-08-03 NOTE — Telephone Encounter (Signed)
Patient called stating that she was told at her 07/14/22 visit that an mri would be order. Pt checking the status.     Please call back [phone]

## 2022-08-04 ENCOUNTER — Other Ambulatory Visit: Payer: Self-pay | Admitting: Physical Medicine and Rehabilitation

## 2022-08-04 DIAGNOSIS — M5412 Radiculopathy, cervical region: Secondary | ICD-10-CM

## 2022-08-10 ENCOUNTER — Ambulatory Visit
Admission: RE | Admit: 2022-08-10 | Discharge: 2022-08-10 | Disposition: A | Discharge status: Home / Self Care | Payer: No Typology Code available for payment source | Source: Ambulatory Visit | Attending: Physical Medicine and Rehabilitation | Admitting: Physical Medicine and Rehabilitation

## 2022-08-10 DIAGNOSIS — M5412 Radiculopathy, cervical region: Secondary | ICD-10-CM

## 2022-08-15 ENCOUNTER — Telehealth: Payer: Self-pay | Admitting: Physical Medicine and Rehabilitation

## 2022-08-15 DIAGNOSIS — M5412 Radiculopathy, cervical region: Secondary | ICD-10-CM

## 2022-08-15 NOTE — Telephone Encounter (Signed)
Please schedule patient for office visit with Catherine Cunningham to review MRI and have a plan going forward.

## 2022-08-31 ENCOUNTER — Ambulatory Visit: Payer: No Typology Code available for payment source | Admitting: Physical Medicine and Rehabilitation

## 2022-09-07 ENCOUNTER — Encounter: Payer: Self-pay | Admitting: Physical Medicine and Rehabilitation

## 2022-09-07 ENCOUNTER — Ambulatory Visit: Payer: No Typology Code available for payment source | Admitting: Physical Medicine and Rehabilitation

## 2022-09-07 VITALS — BP 107/70 | Ht 67.0 in | Wt 198.0 lb

## 2022-09-07 DIAGNOSIS — G8929 Other chronic pain: Secondary | ICD-10-CM | POA: Diagnosis not present

## 2022-09-07 DIAGNOSIS — M542 Cervicalgia: Secondary | ICD-10-CM

## 2022-09-07 DIAGNOSIS — M25511 Pain in right shoulder: Secondary | ICD-10-CM | POA: Diagnosis not present

## 2022-09-07 DIAGNOSIS — M5412 Radiculopathy, cervical region: Secondary | ICD-10-CM | POA: Diagnosis not present

## 2022-09-07 NOTE — Progress Notes (Signed)
[name] - 56 y.o. female MRN [id number]  Date of birth: Sep 02, 1966  Office Visit Note: Visit Date: 09/07/2022 PCP: Ronnell Freshwater, NP Referred by: Ronnell Freshwater, NP  Subjective: Chief Complaint  Patient presents with   Neck - Pain, Follow-up    MRI review   HPI: Catherine Cunningham is a 56 y.o. female who comes in today for evaluation of chronic, worsening and severe right sided neck pain radiating to shoulder and right upper arm.  Pain ongoing for several years and is exacerbated by movement and activity, describes as a dull and aching sensation, currently rates a 7 out of 10.  Some relief of pain with home exercise regimen, rest and use of medications.  Does have history of chiropractic treatments with minimal relief of pain. Patient states she did undergo cervical trigger point injections and cervical epidural steroid injection many years ago while living in [place], states epidural steroid injection significantly helped to alleviate her pain. We are not able to see records from previous treatments at this time.  Recent cervical MRI imaging exhibits bilateral uncovertebral degenerative changes, more prominent on right at C3-C4, there is also severe right foraminal stenosis at this level.  No high-grade spinal canal stenosis noted. 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 States pain is negatively impacting her daily life, some days her pain is worse than others. Patient denies focal weakness, numbness and tingling. Patient denies recent trauma or falls.    Review of Systems  Musculoskeletal:  Positive for neck pain.  Neurological:  Negative for tingling, sensory change, focal weakness and weakness.  All other systems reviewed and are negative.  Otherwise per HPI.  Assessment & Plan: Visit Diagnoses:    ICD-10-CM   1. Chronic neck pain  M54.2    G89.29     2. Cervical radiculopathy  M54.12     3. Chronic right shoulder pain  M25.511     G89.29        Plan: Findings:  Chronic, worsening and severe right-sided neck pain radiating to shoulder and upper arm.  Patient continues to have severe pain despite good conservative therapy such as chiropractic treatments, home exercise regimen, rest and use of medications.  I discussed recent cervical MRI with patient today using imaging and spine model.  Patient's clinical presentation and exam are consistent with C4-C5 nerve pattern.  Next step is to perform diagnostic and hopefully therapeutic right C7-T1 interlaminar epidural steroid injection under fluoroscopic guidance.  Patient is not currently taking anticoagulant medication.  I did discuss cervical epidural steroid injection procedure, patient has no questions at this time.  If she gets significant and sustained relief of pain with injection we did discuss possibility of repeating as warranted.  If her pain persists post injection we would consider regrouping with our in-house physical therapy team.  No red flag symptoms noted upon exam today.    Meds & Orders: No orders of the defined types were placed in this encounter.  No orders of the defined types were placed in this encounter.   Follow-up: Return for Right C7-T1 interlaminar epidural steroid injection.   Procedures: No procedures performed      Clinical History: EXAM: MRI CERVICAL SPINE WITHOUT CONTRAST   TECHNIQUE: Multiplanar, multisequence MR imaging of the cervical spine was performed. No intravenous contrast was administered.   COMPARISON:  None Available.   FINDINGS: Alignment: Loss of the normal cervical lordosis with mild reversal. Minimal anterolisthesis of C7  on T1.   Vertebrae: No acute fracture, evidence of discitis, or aggressive bone lesion.   Cord: Normal signal and morphology.   Posterior Fossa, vertebral arteries, paraspinal tissues: Posterior fossa demonstrates no focal abnormality. Vertebral artery flow voids are maintained. Paraspinal soft  tissues are unremarkable.   Disc levels:   Discs: Degenerative disease with disc height loss at C3-4, C4-5, C5-6 and C6-7.   C2-3: No significant disc bulge. No neural foraminal stenosis. No central canal stenosis.   C3-4: Minimal broad-based disc bulge. Bilateral uncovertebral degenerative changes. Moderate-severe left foraminal stenosis. Severe right foraminal stenosis. No spinal stenosis.   C4-5: Broad-based disc bulge. Moderate right foraminal stenosis. Moderate left foraminal stenosis. No spinal stenosis.   C5-6: Mild broad-based disc bulge. Mild bilateral foraminal stenosis. No spinal stenosis.   C6-7: Mild broad-based disc bulge. No foraminal or central canal stenosis.   C7-T1: No significant disc bulge. No neural foraminal stenosis. No central canal stenosis.   T1-2: Small right paracentral disc protrusion. No foraminal or central canal stenosis.   IMPRESSION: 1. At C3-4 there is a minimal broad-based disc bulge. Bilateral uncovertebral degenerative changes. Moderate-severe left foraminal stenosis. Severe right foraminal stenosis. 2. At C4-5 there is a broad-based disc bulge. Moderate right foraminal stenosis. Moderate left foraminal stenosis. 3. At C5-6 there is a mild broad-based disc bulge. Mild bilateral foraminal stenosis.     Electronically Signed   By: Kathreen Devoid M.D.   On: 08/12/2022 15:28   She reports that she has been smoking cigarettes. She has never used smokeless tobacco. No results for input(s): "HGBA1C", "LABURIC" in the last 8760 hours.  Objective:  VS:  HT:'5\' 7"'$  (170.2 cm)   WT:198 lb (89.8 kg)  BMI:31    BP:107/70  HR: bpm  TEMP: ( )  RESP:  Physical Exam Vitals and nursing note reviewed.  HENT:     Head: Normocephalic and atraumatic.     Right Ear: External ear normal.     Left Ear: External ear normal.     Nose: Nose normal.     Mouth/Throat:     Mouth: Mucous membranes are moist.  Eyes:     Extraocular Movements:  Extraocular movements intact.  Cardiovascular:     Rate and Rhythm: Normal rate.     Pulses: Normal pulses.  Pulmonary:     Effort: Pulmonary effort is normal.  Abdominal:     General: Abdomen is flat. There is no distension.  Musculoskeletal:        General: Tenderness present.     Cervical back: Tenderness present.     Comments: Discomfort noted with flexion, extension and side-to-side rotation. Patient has good strength in the upper extremities including 5 out of 5 strength in wrist extension, long finger flexion and APB.  There is no atrophy of the hands intrinsically. Dysesthesias noted to right C4/C5 dermatome. Sensation intact bilaterally. Negative Hoffman's sign.   Skin:    General: Skin is warm and dry.     Capillary Refill: Capillary refill takes less than 2 seconds.  Neurological:     General: No focal deficit present.     Mental Status: She is alert and oriented to person, place, and time.  Psychiatric:        Mood and Affect: Mood normal.        Behavior: Behavior normal.     Ortho Exam  Imaging: No results found.  Past Medical/Family/Surgical/Social History: Medications & Allergies reviewed per EMR, new medications updated. Patient Active Problem List  Diagnosis Date Noted   Preauricular lymphadenopathy 04/06/2021   Post-nasal drip 04/06/2021   Encounter to establish care 02/22/2021   Bilateral impacted cerumen 02/22/2021   Healthcare maintenance 02/22/2021   Screening for colon cancer 02/22/2021   Pain in joint of left knee 12/02/2020   Other fatigue 10/18/2020   Encounter for screening mammogram for malignant neoplasm of breast 10/18/2020   Vitamin B 12 deficiency 08/01/2018   Vitamin D deficiency 08/01/2018   Major depressive disorder, single episode with psychotic features ([place]) 08/01/2018   Encounter for general adult medical examination with abnormal findings 08/01/2018   Past Medical History:  Diagnosis Date   Depression    Phreesia 02/19/2021    Family History  Problem Relation Age of Onset   Breast cancer Mother 55   Hodgkin's lymphoma Father    Anuerysm Father    Diabetes Mellitus I Brother    Past Surgical History:  Procedure Laterality Date   BREAST BIOPSY Left 2014   neg- Ribbon clip   BREAST BIOPSY Left 10/24/2018   Affirm Bx- Coil Clip- benign   INJECTION ANESTHETIC AGENT TO CERVICAL PLEXUS     Social History   Occupational History   Not on file  Tobacco Use   Smoking status: Some Days    Types: Cigarettes   Smokeless tobacco: Never   Tobacco comments:    smokes when she drinks  Substance and Sexual Activity   Alcohol use: Yes    Alcohol/week: 14.0 standard drinks of alcohol    Types: 7 Glasses of wine, 7 Cans of beer per week    Comment: one a day wine or beer   Drug use: Never   Sexual activity: Not on file

## 2022-09-07 NOTE — Progress Notes (Signed)
Patient is here to review MRI cervical spine. She has neck pain that radiates into the right arm. Most of the pain is from right elbow up. She has history of carpal tunnel, so she does have numbness and tingling in the hand. She takes ibuprofen and aleve with occasional relief.   Numeric Pain Rating Scale and Functional Assessment Average Pain 7   In the last MONTH (on 0-10 scale) has pain interfered with the following?  1. General activity like being  able to carry out your everyday physical activities such as walking, climbing stairs, carrying groceries, or moving a chair?  Rating(9)

## 2022-09-15 ENCOUNTER — Telehealth: Payer: Self-pay | Admitting: Physical Medicine and Rehabilitation

## 2022-09-15 NOTE — Telephone Encounter (Signed)
Received vm from Dr. Ival Bible office requesting 9/6 ov note. Patient not seen 9/6, 09/07/22 ov note faxed to Dr. Ival Bible office [phone] ph [phone]

## 2022-09-21 ENCOUNTER — Telehealth: Payer: Self-pay | Admitting: Physical Medicine and Rehabilitation

## 2022-09-21 NOTE — Telephone Encounter (Signed)
Received vm from Dr. Ival Bible office. Requesting a 9/6 ov note. IC,lmvm [phone] advised that patient was not seen on 9/6 and that on 9/21 I advised this via fax and sent over the 09/07/22 ov note.

## 2022-09-22 ENCOUNTER — Ambulatory Visit: Payer: No Typology Code available for payment source | Admitting: Physical Medicine and Rehabilitation

## 2022-09-22 ENCOUNTER — Ambulatory Visit: Payer: Self-pay

## 2022-09-22 DIAGNOSIS — M5412 Radiculopathy, cervical region: Secondary | ICD-10-CM | POA: Diagnosis not present

## 2022-09-22 MED ORDER — METHYLPREDNISOLONE ACETATE 80 MG/ML IJ SUSP
80.0000 mg | Freq: Once | INTRAMUSCULAR | Status: AC
  Administered 2022-09-22: 80 mg

## 2022-09-22 NOTE — Progress Notes (Deleted)
.  fnp

## 2022-09-22 NOTE — Patient Instructions (Signed)

## 2022-09-22 NOTE — Progress Notes (Signed)
Numeric Pain Rating Scale and Functional Assessment Average Pain 4   In the last MONTH (on 0-10 scale) has pain interfered with the following?  1. General activity like being  able to carry out your everyday physical activities such as walking, climbing stairs, carrying groceries, or moving a chair?  Rating(5) Its more when she is moving her arms is the most pain   +Driver, -BT, -Dye Allergies.  121/81

## 2022-09-23 NOTE — Procedures (Signed)
Cervical Epidural Steroid Injection - Interlaminar Approach with Fluoroscopic Guidance  Patient: Catherine Cunningham      Date of Birth: [birth year] MRN: [id number] PCP: Ronnell Freshwater, NP      Visit Date: 09/22/2022   Universal Protocol:    Date/Time: 09/29/238:18 PM  Consent Given By: the patient  Position: PRONE  Additional Comments: Vital signs were monitored before and after the procedure. Patient was prepped and draped in the usual sterile fashion. The correct patient, procedure, and site was verified.   Injection Procedure Details:   Procedure diagnoses: Cervical radiculopathy [M54.12]    Meds Administered:  Meds ordered this encounter  Medications   methylPREDNISolone acetate (DEPO-MEDROL) injection 80 mg     Laterality: Right  Location/Site: C7-T1  Needle: 3.5 in., 20 ga. Tuohy  Needle Placement: Paramedian epidural space  Findings:  -Comments: Excellent flow of contrast into the epidural space.  Procedure Details: Using a paramedian approach from the side mentioned above, the region overlying the inferior lamina was localized under fluoroscopic visualization and the soft tissues overlying this structure were infiltrated with 4 ml. of 1% Lidocaine without Epinephrine. A # 20 gauge, Tuohy needle was inserted into the epidural space using a paramedian approach.  The epidural space was localized using loss of resistance along with contralateral oblique bi-planar fluoroscopic views.  After negative aspirate for air, blood, and CSF, a 2 ml. volume of Isovue-250 was injected into the epidural space and the flow of contrast was observed. Radiographs were obtained for documentation purposes.   The injectate was administered into the level noted above.  Additional Comments:  The patient tolerated the procedure well Dressing: 2 x 2 sterile gauze and Band-Aid    Post-procedure details: Patient was observed during the procedure. Post-procedure instructions were  reviewed.  Patient left the clinic in stable condition.

## 2022-09-23 NOTE — Progress Notes (Signed)
Catherine Cunningham - 56 y.o. female MRN [id number]  Date of birth: 1966/01/28  Office Visit Note: Visit Date: 09/22/2022 PCP: Ronnell Freshwater, NP Referred by: Lorine Bears, NP  Subjective: Chief Complaint  Patient presents with   Neck - Pain   HPI:  Catherine Cunningham is a 56 y.o. female who comes in today at the request of Barnet Pall, FNP for planned Right C7-T1 Cervical Interlaminar epidural steroid injection with fluoroscopic guidance.  The patient has failed conservative care including home exercise, medications, time and activity modification.  This injection will be diagnostic and hopefully therapeutic.  Please see requesting physician notes for further details and justification.   ROS Otherwise per HPI.  Assessment & Plan: Visit Diagnoses:    ICD-10-CM   1. Cervical radiculopathy  M54.12 XR C-ARM NO REPORT    Epidural Steroid injection    methylPREDNISolone acetate (DEPO-MEDROL) injection 80 mg      Plan: No additional findings.   Meds & Orders:  Meds ordered this encounter  Medications   methylPREDNISolone acetate (DEPO-MEDROL) injection 80 mg    Orders Placed This Encounter  Procedures   XR C-ARM NO REPORT   Epidural Steroid injection    Follow-up: Return for visit to requesting provider as needed.   Procedures: No procedures performed  Cervical Epidural Steroid Injection - Interlaminar Approach with Fluoroscopic Guidance  Patient: Catherine Cunningham      Date of Birth: [birth year] MRN: [id number] PCP: Ronnell Freshwater, NP      Visit Date: 09/22/2022   Universal Protocol:    Date/Time: 09/29/238:18 PM  Consent Given By: the patient  Position: PRONE  Additional Comments: Vital signs were monitored before and after the procedure. Patient was prepped and draped in the usual sterile fashion. The correct patient, procedure, and site was verified.   Injection Procedure Details:   Procedure diagnoses: Cervical radiculopathy [M54.12]    Meds  Administered:  Meds ordered this encounter  Medications   methylPREDNISolone acetate (DEPO-MEDROL) injection 80 mg     Laterality: Right  Location/Site: C7-T1  Needle: 3.5 in., 20 ga. Tuohy  Needle Placement: Paramedian epidural space  Findings:  -Comments: Excellent flow of contrast into the epidural space.  Procedure Details: Using a paramedian approach from the side mentioned above, the region overlying the inferior lamina was localized under fluoroscopic visualization and the soft tissues overlying this structure were infiltrated with 4 ml. of 1% Lidocaine without Epinephrine. A # 20 gauge, Tuohy needle was inserted into the epidural space using a paramedian approach.  The epidural space was localized using loss of resistance along with contralateral oblique bi-planar fluoroscopic views.  After negative aspirate for air, blood, and CSF, a 2 ml. volume of Isovue-250 was injected into the epidural space and the flow of contrast was observed. Radiographs were obtained for documentation purposes.   The injectate was administered into the level noted above.  Additional Comments:  The patient tolerated the procedure well Dressing: 2 x 2 sterile gauze and Band-Aid    Post-procedure details: Patient was observed during the procedure. Post-procedure instructions were reviewed.  Patient left the clinic in stable condition.   Clinical History: EXAM: MRI CERVICAL SPINE WITHOUT CONTRAST   TECHNIQUE: Multiplanar, multisequence MR imaging of the cervical spine was performed. No intravenous contrast was administered.   COMPARISON:  None Available.   FINDINGS: Alignment: Loss of the normal cervical lordosis with mild reversal. Minimal anterolisthesis of C7 on T1.   Vertebrae: No acute fracture, evidence  of discitis, or aggressive bone lesion.   Cord: Normal signal and morphology.   Posterior Fossa, vertebral arteries, paraspinal tissues: Posterior fossa demonstrates no focal  abnormality. Vertebral artery flow voids are maintained. Paraspinal soft tissues are unremarkable.   Disc levels:   Discs: Degenerative disease with disc height loss at C3-4, C4-5, C5-6 and C6-7.   C2-3: No significant disc bulge. No neural foraminal stenosis. No central canal stenosis.   C3-4: Minimal broad-based disc bulge. Bilateral uncovertebral degenerative changes. Moderate-severe left foraminal stenosis. Severe right foraminal stenosis. No spinal stenosis.   C4-5: Broad-based disc bulge. Moderate right foraminal stenosis. Moderate left foraminal stenosis. No spinal stenosis.   C5-6: Mild broad-based disc bulge. Mild bilateral foraminal stenosis. No spinal stenosis.   C6-7: Mild broad-based disc bulge. No foraminal or central canal stenosis.   C7-T1: No significant disc bulge. No neural foraminal stenosis. No central canal stenosis.   T1-2: Small right paracentral disc protrusion. No foraminal or central canal stenosis.   IMPRESSION: 1. At C3-4 there is a minimal broad-based disc bulge. Bilateral uncovertebral degenerative changes. Moderate-severe left foraminal stenosis. Severe right foraminal stenosis. 2. At C4-5 there is a broad-based disc bulge. Moderate right foraminal stenosis. Moderate left foraminal stenosis. 3. At C5-6 there is a mild broad-based disc bulge. Mild bilateral foraminal stenosis.     Electronically Signed   By: Kathreen Devoid M.D.   On: 08/12/2022 15:28     Objective:  VS:  HT:    WT:   BMI:     BP:   HR: bpm  TEMP: ( )  RESP:  Physical Exam Vitals and nursing note reviewed.  Constitutional:      General: She is not in acute distress.    Appearance: Normal appearance. She is not ill-appearing.  HENT:     Head: Normocephalic and atraumatic.     Right Ear: External ear normal.     Left Ear: External ear normal.  Eyes:     Extraocular Movements: Extraocular movements intact.  Cardiovascular:     Rate and Rhythm: Normal rate.      Pulses: Normal pulses.  Musculoskeletal:     Cervical back: Tenderness present. No rigidity.     Right lower leg: No edema.     Left lower leg: No edema.     Comments: Patient has good strength in the upper extremities including 5 out of 5 strength in wrist extension long finger flexion and APB.  There is no atrophy of the hands intrinsically.  There is a negative Hoffmann's test.   Lymphadenopathy:     Cervical: No cervical adenopathy.  Skin:    Findings: No erythema, lesion or rash.  Neurological:     General: No focal deficit present.     Mental Status: She is alert and oriented to person, place, and time.     Sensory: No sensory deficit.     Motor: No weakness or abnormal muscle tone.     Coordination: Coordination normal.  Psychiatric:        Mood and Affect: Mood normal.        Behavior: Behavior normal.      Imaging: No results found.

## 2022-09-30 ENCOUNTER — Other Ambulatory Visit (HOSPITAL_BASED_OUTPATIENT_CLINIC_OR_DEPARTMENT_OTHER): Payer: Self-pay

## 2022-10-05 ENCOUNTER — Telehealth: Payer: Self-pay | Admitting: Physical Medicine and Rehabilitation

## 2022-10-05 NOTE — Telephone Encounter (Signed)
Received call from Dr.Dewey's office requesting OV notes from past 2 appts. I have faxed notes to [phone].

## 2022-11-15 ENCOUNTER — Other Ambulatory Visit (HOSPITAL_BASED_OUTPATIENT_CLINIC_OR_DEPARTMENT_OTHER): Payer: Self-pay

## 2022-11-17 LAB — LIPID PANEL
Cholesterol: 219 — AB (ref 0–200)
HDL: 78 — AB (ref 35–70)
LDL Cholesterol: 126
Triglycerides: 74 (ref 40–160)

## 2022-11-17 LAB — VITAMIN D 25 HYDROXY (VIT D DEFICIENCY, FRACTURES): Vit D, 25-Hydroxy: 35.8

## 2022-11-28 ENCOUNTER — Other Ambulatory Visit (HOSPITAL_BASED_OUTPATIENT_CLINIC_OR_DEPARTMENT_OTHER): Payer: Self-pay | Admitting: Nurse Practitioner

## 2022-11-28 DIAGNOSIS — Z83438 Family history of other disorder of lipoprotein metabolism and other lipidemia: Secondary | ICD-10-CM

## 2022-12-12 ENCOUNTER — Other Ambulatory Visit (HOSPITAL_BASED_OUTPATIENT_CLINIC_OR_DEPARTMENT_OTHER): Payer: Self-pay

## 2022-12-13 ENCOUNTER — Ambulatory Visit (HOSPITAL_COMMUNITY)
Admission: RE | Admit: 2022-12-13 | Discharge: 2022-12-13 | Disposition: A | Discharge status: Home / Self Care | Payer: Self-pay | Source: Ambulatory Visit | Attending: Nurse Practitioner | Admitting: Nurse Practitioner

## 2022-12-13 DIAGNOSIS — Z83438 Family history of other disorder of lipoprotein metabolism and other lipidemia: Secondary | ICD-10-CM | POA: Insufficient documentation

## 2022-12-20 ENCOUNTER — Other Ambulatory Visit (HOSPITAL_BASED_OUTPATIENT_CLINIC_OR_DEPARTMENT_OTHER): Payer: Self-pay

## 2023-01-06 ENCOUNTER — Other Ambulatory Visit (HOSPITAL_BASED_OUTPATIENT_CLINIC_OR_DEPARTMENT_OTHER): Payer: Self-pay

## 2023-01-06 DIAGNOSIS — E782 Mixed hyperlipidemia: Secondary | ICD-10-CM | POA: Diagnosis not present

## 2023-01-06 DIAGNOSIS — R911 Solitary pulmonary nodule: Secondary | ICD-10-CM | POA: Diagnosis not present

## 2023-01-06 DIAGNOSIS — Z6830 Body mass index (BMI) 30.0-30.9, adult: Secondary | ICD-10-CM | POA: Diagnosis not present

## 2023-01-06 DIAGNOSIS — E669 Obesity, unspecified: Secondary | ICD-10-CM | POA: Diagnosis not present

## 2023-01-06 MED ORDER — ZEPBOUND 2.5 MG/0.5ML ~~LOC~~ SOAJ
2.5000 mg | SUBCUTANEOUS | 3 refills | Status: DC
  Filled 2023-01-06: qty 2, 28d supply, fill #0

## 2023-01-09 ENCOUNTER — Other Ambulatory Visit (HOSPITAL_BASED_OUTPATIENT_CLINIC_OR_DEPARTMENT_OTHER): Payer: Self-pay

## 2023-01-10 ENCOUNTER — Other Ambulatory Visit: Payer: Self-pay | Admitting: Family Medicine

## 2023-01-10 DIAGNOSIS — Z1231 Encounter for screening mammogram for malignant neoplasm of breast: Secondary | ICD-10-CM

## 2023-01-11 ENCOUNTER — Other Ambulatory Visit (HOSPITAL_BASED_OUTPATIENT_CLINIC_OR_DEPARTMENT_OTHER): Payer: Self-pay

## 2023-01-11 ENCOUNTER — Encounter (HOSPITAL_BASED_OUTPATIENT_CLINIC_OR_DEPARTMENT_OTHER): Payer: Self-pay | Admitting: Pharmacist

## 2023-01-11 MED ORDER — SAXENDA 18 MG/3ML ~~LOC~~ SOPN
0.6000 mg | PEN_INJECTOR | SUBCUTANEOUS | 0 refills | Status: DC
  Filled 2023-01-11: qty 15, 30d supply, fill #0

## 2023-01-12 ENCOUNTER — Other Ambulatory Visit (HOSPITAL_BASED_OUTPATIENT_CLINIC_OR_DEPARTMENT_OTHER): Payer: Self-pay

## 2023-01-12 MED ORDER — BD PEN NEEDLE MINI U/F 31G X 5 MM MISC
0 refills | Status: DC
  Filled 2023-01-12: qty 100, 90d supply, fill #0

## 2023-01-13 ENCOUNTER — Other Ambulatory Visit (HOSPITAL_BASED_OUTPATIENT_CLINIC_OR_DEPARTMENT_OTHER): Payer: Self-pay

## 2023-01-14 ENCOUNTER — Other Ambulatory Visit (HOSPITAL_BASED_OUTPATIENT_CLINIC_OR_DEPARTMENT_OTHER): Payer: Self-pay

## 2023-01-17 ENCOUNTER — Other Ambulatory Visit (HOSPITAL_COMMUNITY): Payer: Self-pay

## 2023-01-17 ENCOUNTER — Other Ambulatory Visit (HOSPITAL_BASED_OUTPATIENT_CLINIC_OR_DEPARTMENT_OTHER): Payer: Self-pay

## 2023-01-17 DIAGNOSIS — R109 Unspecified abdominal pain: Secondary | ICD-10-CM | POA: Diagnosis not present

## 2023-01-17 DIAGNOSIS — N390 Urinary tract infection, site not specified: Secondary | ICD-10-CM | POA: Diagnosis not present

## 2023-01-17 DIAGNOSIS — M47812 Spondylosis without myelopathy or radiculopathy, cervical region: Secondary | ICD-10-CM | POA: Diagnosis not present

## 2023-01-17 DIAGNOSIS — K219 Gastro-esophageal reflux disease without esophagitis: Secondary | ICD-10-CM | POA: Diagnosis not present

## 2023-01-17 DIAGNOSIS — E669 Obesity, unspecified: Secondary | ICD-10-CM | POA: Diagnosis not present

## 2023-01-17 MED ORDER — FAMOTIDINE 40 MG PO TABS
40.0000 mg | ORAL_TABLET | Freq: Every day | ORAL | 4 refills | Status: DC
  Filled 2023-01-17: qty 90, 90d supply, fill #0

## 2023-01-17 MED ORDER — OMEPRAZOLE 40 MG PO CPDR
40.0000 mg | DELAYED_RELEASE_CAPSULE | Freq: Two times a day (BID) | ORAL | 1 refills | Status: DC | PRN
  Filled 2023-01-17: qty 145, 73d supply, fill #0

## 2023-01-18 ENCOUNTER — Other Ambulatory Visit (HOSPITAL_BASED_OUTPATIENT_CLINIC_OR_DEPARTMENT_OTHER): Payer: Self-pay

## 2023-01-18 DIAGNOSIS — R109 Unspecified abdominal pain: Secondary | ICD-10-CM | POA: Diagnosis not present

## 2023-01-19 ENCOUNTER — Other Ambulatory Visit: Payer: Self-pay | Admitting: Family Medicine

## 2023-01-19 DIAGNOSIS — R109 Unspecified abdominal pain: Secondary | ICD-10-CM

## 2023-01-19 LAB — BASIC METABOLIC PANEL
BUN: 22 — AB (ref 4–21)
CO2: 25 — AB (ref 13–22)
Chloride: 104 (ref 99–108)
Creatinine: 0.8 (ref 0.5–1.1)
Glucose: 98
Potassium: 4.4 mEq/L (ref 3.5–5.1)
Sodium: 138 (ref 137–147)

## 2023-01-19 LAB — HEPATIC FUNCTION PANEL
ALT: 19 U/L (ref 7–35)
AST: 21 (ref 13–35)
Alkaline Phosphatase: 88 (ref 25–125)
Bilirubin, Total: 0.5

## 2023-01-19 LAB — CBC: RBC: 4.83 (ref 3.87–5.11)

## 2023-01-19 LAB — CBC AND DIFFERENTIAL
HCT: 43 (ref 36–46)
Hemoglobin: 14.7 (ref 12.0–16.0)
Platelets: 301 10*3/uL (ref 150–400)
WBC: 4.9

## 2023-01-19 LAB — COMPREHENSIVE METABOLIC PANEL
Albumin: 4.4 (ref 3.5–5.0)
Calcium: 9.3 (ref 8.7–10.7)
Globulin: 2.1

## 2023-01-20 ENCOUNTER — Other Ambulatory Visit: Payer: Self-pay | Admitting: Family Medicine

## 2023-01-20 ENCOUNTER — Ambulatory Visit
Admission: RE | Admit: 2023-01-20 | Discharge: 2023-01-20 | Disposition: A | Discharge status: Home / Self Care | Payer: 59 | Source: Ambulatory Visit | Attending: Family Medicine | Admitting: Family Medicine

## 2023-01-20 DIAGNOSIS — R1084 Generalized abdominal pain: Secondary | ICD-10-CM

## 2023-01-20 DIAGNOSIS — R109 Unspecified abdominal pain: Secondary | ICD-10-CM | POA: Diagnosis not present

## 2023-01-23 ENCOUNTER — Ambulatory Visit
Admission: RE | Admit: 2023-01-23 | Discharge: 2023-01-23 | Disposition: A | Discharge status: Home / Self Care | Payer: 59 | Source: Ambulatory Visit | Attending: Family Medicine | Admitting: Family Medicine

## 2023-01-23 DIAGNOSIS — N281 Cyst of kidney, acquired: Secondary | ICD-10-CM | POA: Diagnosis not present

## 2023-01-23 DIAGNOSIS — R109 Unspecified abdominal pain: Secondary | ICD-10-CM

## 2023-01-24 ENCOUNTER — Inpatient Hospital Stay: Admission: RE | Admit: 2023-01-24 | Payer: 59 | Source: Ambulatory Visit

## 2023-01-30 ENCOUNTER — Other Ambulatory Visit (HOSPITAL_BASED_OUTPATIENT_CLINIC_OR_DEPARTMENT_OTHER): Payer: Self-pay | Admitting: Family Medicine

## 2023-01-30 DIAGNOSIS — R93429 Abnormal radiologic findings on diagnostic imaging of unspecified kidney: Secondary | ICD-10-CM

## 2023-01-31 ENCOUNTER — Encounter (HOSPITAL_BASED_OUTPATIENT_CLINIC_OR_DEPARTMENT_OTHER): Payer: Self-pay

## 2023-01-31 ENCOUNTER — Ambulatory Visit (HOSPITAL_BASED_OUTPATIENT_CLINIC_OR_DEPARTMENT_OTHER)
Admission: RE | Admit: 2023-01-31 | Discharge: 2023-01-31 | Disposition: A | Discharge status: Home / Self Care | Payer: 59 | Source: Ambulatory Visit | Attending: Family Medicine | Admitting: Family Medicine

## 2023-01-31 DIAGNOSIS — K769 Liver disease, unspecified: Secondary | ICD-10-CM | POA: Diagnosis not present

## 2023-01-31 DIAGNOSIS — R93429 Abnormal radiologic findings on diagnostic imaging of unspecified kidney: Secondary | ICD-10-CM | POA: Diagnosis not present

## 2023-01-31 DIAGNOSIS — N281 Cyst of kidney, acquired: Secondary | ICD-10-CM | POA: Diagnosis not present

## 2023-01-31 MED ORDER — IOHEXOL 300 MG/ML  SOLN
100.0000 mL | Freq: Once | INTRAMUSCULAR | Status: AC | PRN
  Administered 2023-01-31: 100 mL via INTRAVENOUS

## 2023-02-01 ENCOUNTER — Other Ambulatory Visit (HOSPITAL_BASED_OUTPATIENT_CLINIC_OR_DEPARTMENT_OTHER): Payer: Self-pay

## 2023-02-01 ENCOUNTER — Encounter (HOSPITAL_BASED_OUTPATIENT_CLINIC_OR_DEPARTMENT_OTHER): Payer: Self-pay

## 2023-02-01 DIAGNOSIS — D219 Benign neoplasm of connective and other soft tissue, unspecified: Secondary | ICD-10-CM | POA: Diagnosis not present

## 2023-02-01 DIAGNOSIS — R911 Solitary pulmonary nodule: Secondary | ICD-10-CM | POA: Diagnosis not present

## 2023-02-01 DIAGNOSIS — R109 Unspecified abdominal pain: Secondary | ICD-10-CM | POA: Diagnosis not present

## 2023-02-01 DIAGNOSIS — N281 Cyst of kidney, acquired: Secondary | ICD-10-CM | POA: Diagnosis not present

## 2023-02-01 DIAGNOSIS — E278 Other specified disorders of adrenal gland: Secondary | ICD-10-CM | POA: Diagnosis not present

## 2023-02-01 DIAGNOSIS — K219 Gastro-esophageal reflux disease without esophagitis: Secondary | ICD-10-CM | POA: Diagnosis not present

## 2023-02-01 MED ORDER — LINZESS 72 MCG PO CAPS
ORAL_CAPSULE | ORAL | 1 refills | Status: DC
  Filled 2023-02-01: qty 90, 90d supply, fill #0

## 2023-02-03 ENCOUNTER — Other Ambulatory Visit (HOSPITAL_BASED_OUTPATIENT_CLINIC_OR_DEPARTMENT_OTHER): Payer: Self-pay | Admitting: Family Medicine

## 2023-02-03 DIAGNOSIS — R911 Solitary pulmonary nodule: Secondary | ICD-10-CM

## 2023-02-03 DIAGNOSIS — D219 Benign neoplasm of connective and other soft tissue, unspecified: Secondary | ICD-10-CM

## 2023-02-03 DIAGNOSIS — Q2730 Arteriovenous malformation, site unspecified: Secondary | ICD-10-CM

## 2023-02-05 ENCOUNTER — Ambulatory Visit (HOSPITAL_BASED_OUTPATIENT_CLINIC_OR_DEPARTMENT_OTHER)
Admission: RE | Admit: 2023-02-05 | Discharge: 2023-02-05 | Disposition: A | Discharge status: Home / Self Care | Payer: 59 | Source: Ambulatory Visit | Attending: Family Medicine | Admitting: Family Medicine

## 2023-02-05 DIAGNOSIS — D219 Benign neoplasm of connective and other soft tissue, unspecified: Secondary | ICD-10-CM

## 2023-02-12 ENCOUNTER — Ambulatory Visit (HOSPITAL_BASED_OUTPATIENT_CLINIC_OR_DEPARTMENT_OTHER)
Admission: RE | Admit: 2023-02-12 | Discharge: 2023-02-12 | Disposition: A | Discharge status: Home / Self Care | Payer: 59 | Source: Ambulatory Visit | Attending: Family Medicine | Admitting: Family Medicine

## 2023-02-12 DIAGNOSIS — D219 Benign neoplasm of connective and other soft tissue, unspecified: Secondary | ICD-10-CM | POA: Insufficient documentation

## 2023-02-12 DIAGNOSIS — N858 Other specified noninflammatory disorders of uterus: Secondary | ICD-10-CM | POA: Diagnosis not present

## 2023-02-22 ENCOUNTER — Other Ambulatory Visit (HOSPITAL_BASED_OUTPATIENT_CLINIC_OR_DEPARTMENT_OTHER): Payer: Self-pay | Admitting: Family Medicine

## 2023-02-22 ENCOUNTER — Ambulatory Visit (HOSPITAL_BASED_OUTPATIENT_CLINIC_OR_DEPARTMENT_OTHER)
Admission: RE | Admit: 2023-02-22 | Discharge: 2023-02-22 | Disposition: A | Discharge status: Home / Self Care | Payer: 59 | Source: Ambulatory Visit | Attending: Family Medicine | Admitting: Family Medicine

## 2023-02-22 DIAGNOSIS — Q2730 Arteriovenous malformation, site unspecified: Secondary | ICD-10-CM | POA: Insufficient documentation

## 2023-02-22 DIAGNOSIS — R911 Solitary pulmonary nodule: Secondary | ICD-10-CM

## 2023-02-22 MED ORDER — IOHEXOL 350 MG/ML SOLN
75.0000 mL | Freq: Once | INTRAVENOUS | Status: AC | PRN
  Administered 2023-02-22: 75 mL via INTRAVENOUS

## 2023-02-24 DIAGNOSIS — D259 Leiomyoma of uterus, unspecified: Secondary | ICD-10-CM

## 2023-02-24 HISTORY — DX: Leiomyoma of uterus, unspecified: D25.9

## 2023-03-01 ENCOUNTER — Other Ambulatory Visit (HOSPITAL_BASED_OUTPATIENT_CLINIC_OR_DEPARTMENT_OTHER): Payer: Self-pay

## 2023-03-01 ENCOUNTER — Other Ambulatory Visit: Payer: Self-pay

## 2023-03-01 DIAGNOSIS — E669 Obesity, unspecified: Secondary | ICD-10-CM | POA: Diagnosis not present

## 2023-03-01 DIAGNOSIS — D259 Leiomyoma of uterus, unspecified: Secondary | ICD-10-CM | POA: Diagnosis not present

## 2023-03-01 DIAGNOSIS — R911 Solitary pulmonary nodule: Secondary | ICD-10-CM | POA: Diagnosis not present

## 2023-03-01 DIAGNOSIS — R109 Unspecified abdominal pain: Secondary | ICD-10-CM | POA: Diagnosis not present

## 2023-03-01 MED ORDER — PHENTERMINE HCL 15 MG PO CAPS
15.0000 mg | ORAL_CAPSULE | Freq: Every morning | ORAL | 0 refills | Status: DC
  Filled 2023-03-01: qty 19, 19d supply, fill #0
  Filled 2023-03-01: qty 20, 20d supply, fill #0
  Filled 2023-03-01: qty 10, 10d supply, fill #0
  Filled 2023-03-01: qty 11, 11d supply, fill #0
  Filled 2023-03-01: qty 19, 19d supply, fill #0
  Filled 2023-03-01: qty 11, 11d supply, fill #0

## 2023-04-03 ENCOUNTER — Ambulatory Visit
Admission: RE | Admit: 2023-04-03 | Discharge: 2023-04-03 | Disposition: A | Discharge status: Home / Self Care | Payer: 59 | Source: Ambulatory Visit | Attending: Family Medicine | Admitting: Family Medicine

## 2023-04-03 DIAGNOSIS — Z1231 Encounter for screening mammogram for malignant neoplasm of breast: Secondary | ICD-10-CM

## 2023-04-09 ENCOUNTER — Other Ambulatory Visit (HOSPITAL_BASED_OUTPATIENT_CLINIC_OR_DEPARTMENT_OTHER): Payer: Self-pay

## 2023-04-10 ENCOUNTER — Other Ambulatory Visit: Payer: Self-pay

## 2023-04-11 ENCOUNTER — Encounter (HOSPITAL_BASED_OUTPATIENT_CLINIC_OR_DEPARTMENT_OTHER): Payer: Self-pay | Admitting: Pharmacist

## 2023-04-11 ENCOUNTER — Other Ambulatory Visit (HOSPITAL_BASED_OUTPATIENT_CLINIC_OR_DEPARTMENT_OTHER): Payer: Self-pay

## 2023-04-18 ENCOUNTER — Other Ambulatory Visit (HOSPITAL_BASED_OUTPATIENT_CLINIC_OR_DEPARTMENT_OTHER): Payer: Self-pay

## 2023-04-19 ENCOUNTER — Other Ambulatory Visit (HOSPITAL_BASED_OUTPATIENT_CLINIC_OR_DEPARTMENT_OTHER): Payer: Self-pay

## 2023-04-21 ENCOUNTER — Ambulatory Visit (INDEPENDENT_AMBULATORY_CARE_PROVIDER_SITE_OTHER): Payer: 59 | Admitting: Family Medicine

## 2023-04-21 ENCOUNTER — Other Ambulatory Visit (HOSPITAL_BASED_OUTPATIENT_CLINIC_OR_DEPARTMENT_OTHER): Payer: Self-pay

## 2023-04-21 ENCOUNTER — Encounter (HOSPITAL_BASED_OUTPATIENT_CLINIC_OR_DEPARTMENT_OTHER): Payer: Self-pay | Admitting: Family Medicine

## 2023-04-21 VITALS — BP 121/89 | HR 77 | Ht 67.0 in | Wt 209.0 lb

## 2023-04-21 DIAGNOSIS — F330 Major depressive disorder, recurrent, mild: Secondary | ICD-10-CM | POA: Diagnosis not present

## 2023-04-21 DIAGNOSIS — Z6832 Body mass index (BMI) 32.0-32.9, adult: Secondary | ICD-10-CM | POA: Insufficient documentation

## 2023-04-21 MED ORDER — PHENTERMINE HCL 15 MG PO CAPS
15.0000 mg | ORAL_CAPSULE | Freq: Every morning | ORAL | 0 refills | Status: DC
Start: 2023-04-21 — End: 2023-07-21
  Filled 2023-04-21: qty 30, 30d supply, fill #0

## 2023-04-21 MED ORDER — BUPROPION HCL ER (XL) 300 MG PO TB24
300.0000 mg | ORAL_TABLET | Freq: Every day | ORAL | 0 refills | Status: DC
Start: 2023-04-21 — End: 2023-12-04
  Filled 2023-04-21 – 2023-08-08 (×2): qty 90, 90d supply, fill #0

## 2023-04-21 NOTE — Progress Notes (Unsigned)
New Patient Office Visit  Subjective    Patient ID: Catherine Cunningham, female    DOB: 02/23/1966  Age: 57 y.o. MRN: [id number]  HPI Catherine Cunningham is a 57 yo female patient who presents to establish care.  Former PCP: Dr. William Hamburger, will obtain records   Goal: 175lbs Highest weight was 227lb. She did weight watchers during COVID and was able to get down to 192lb.  Having issues with weight gaining since age 62, reports having recent issues with motivation.  Does not want her weight to impact development of DM2 & HTN  She reports that she tries to watches what she eats but has a sweet tooth.  Had a really hard year last year with stress- brother died suddenly died at 52 from MI (mostly related to COVID), husband bilateral knee replacements. Works from home, loves her job but sits all day  Does not go to gym anymore before going to work- has no one to go with.  Currently taking Zoloft 100mg  daily & Bupropion 300mg  daily, reports her mood is doing well.   Phentermine- did for 30 days  Denies side effects    Outpatient Encounter Medications as of 04/21/2023  Medication Sig   sertraline (ZOLOFT) 100 MG tablet 1 tablet orally daily in the mornings   triamcinolone cream (KENALOG) 0.5 % 1 application topically to affected area daily for 2-4 weeks   [DISCONTINUED] buPROPion (WELLBUTRIN XL) 300 MG 24 hr tablet TAKE 1 TABLET BY MOUTH EVERY DAY   [DISCONTINUED] phentermine 15 MG capsule Take 1 capsule (15 mg total) by mouth in the morning.   buPROPion (WELLBUTRIN XL) 300 MG 24 hr tablet Take 1 tablet (300 mg total) by mouth daily.   phentermine 15 MG capsule Take 1 capsule (15 mg total) by mouth in the morning.   [DISCONTINUED] buPROPion (WELLBUTRIN XL) 300 MG 24 hr tablet Take 1 tablet by mouth once daily.   [DISCONTINUED] buPROPion (WELLBUTRIN XL) 300 MG 24 hr tablet Take 1 tablet (300 mg total) by mouth daily.   [DISCONTINUED] buPROPion (WELLBUTRIN XL) 300 MG 24 hr tablet Take by mouth.    [DISCONTINUED] famotidine (PEPCID) 40 MG tablet Take 1 tablet (40 mg total) by mouth daily for heartburn.   [DISCONTINUED] FLUoxetine (PROZAC) 20 MG capsule Take 1 capsule (20 mg total) by mouth daily. (Patient not taking: Reported on 09/07/2022)   [DISCONTINUED] FLUoxetine (PROZAC) 20 MG capsule Take 1 capsule (20 mg total) by mouth daily.   [DISCONTINUED] Insulin Pen Needle (B-D UF III MINI PEN NEEDLES) 31G X 5 MM MISC use once daily with saxenda injections   [DISCONTINUED] linaclotide (LINZESS) 72 MCG capsule Take 1 capsule by mouth daily at least 30 minutes before the first meal of the day on an empty stomach   [DISCONTINUED] Liraglutide -Weight Management (SAXENDA) 18 MG/3ML SOPN To start, inject 0.6 mg into the skin daily for 1 week then 1.2 mg for week 2 then 1.8 mg on week 3 then 2.4 mg on week 4 then start full dose of 3 mg daily   [DISCONTINUED] omeprazole (PRILOSEC) 40 MG capsule Take 1 capsule (40 mg total) by mouth 2 (two) times daily 30 minutes before meals as needed.   [DISCONTINUED] tirzepatide (ZEPBOUND) 2.5 MG/0.5ML Pen Inject 2.5mg  once a week   No facility-administered encounter medications on file as of 04/21/2023.   Past Medical History:  Diagnosis Date   Depression    [employer] 02/19/2021    Past Surgical History:  Procedure Laterality Date  BREAST BIOPSY Left 2014   neg- Ribbon clip   BREAST BIOPSY Left 10/24/2018   Affirm Bx- Coil Clip- benign   INJECTION ANESTHETIC AGENT TO CERVICAL PLEXUS      Family History  Problem Relation Age of Onset   Breast cancer Mother 28   Hodgkin's lymphoma Father    Anuerysm Father    Diabetes Mellitus I Brother    Heart attack Brother     Social History   Socioeconomic History   Marital status: Married    Spouse name: Not on file   Number of children: Not on file   Years of education: Not on file   Highest education level: Not on file  Occupational History   Not on file  Tobacco Use   Smoking status: Some Days     Types: Cigarettes   Smokeless tobacco: Never   Tobacco comments:    smokes when she drinks  Substance and Sexual Activity   Alcohol use: Yes    Alcohol/week: 14.0 standard drinks of alcohol    Types: 7 Glasses of wine, 7 Cans of beer per week    Comment: one a day wine or beer   Drug use: Never   Sexual activity: Not on file  Other Topics Concern   Not on file  Social History Narrative   Not on file   Social Determinants of Health   Financial Resource Strain: Not on file  Food Insecurity: Not on file  Transportation Needs: Not on file  Physical Activity: Not on file  Stress: Not on file  Social Connections: Not on file  Intimate Partner Violence: Not on file    Review of Systems  Constitutional:  Negative for malaise/fatigue.  Eyes:  Negative for blurred vision and double vision.  Respiratory:  Negative for cough and shortness of breath.   Cardiovascular:  Negative for chest pain and palpitations.  Gastrointestinal:  Negative for abdominal pain, nausea and vomiting.  Musculoskeletal:  Negative for myalgias.  Neurological:  Negative for dizziness, weakness and headaches.  Psychiatric/Behavioral:  Negative for depression and suicidal ideas. The patient is not nervous/anxious and does not have insomnia.       04/21/2023    8:55 AM 04/06/2021    8:22 AM 02/22/2021   10:22 AM 10/02/2020   10:38 AM 07/06/2018    3:27 PM  Depression screen PHQ 2/9  Decreased Interest 0 0 0 0 0  Down, Depressed, Hopeless 0 0 0 0 0  PHQ - 2 Score 0 0 0 0 0  Altered sleeping  0 0    Tired, decreased energy  0 0    Change in appetite  0 1    Feeling bad or failure about yourself   0 0    Trouble concentrating  0 0    Moving slowly or fidgety/restless  0 0    Suicidal thoughts  0 0    PHQ-9 Score  0 1       Objective    BP 121/89 Comment: Repeat BP  Pulse 77   Ht 5\' 7"  (1.702 m)   Wt 209 lb (94.8 kg)   SpO2 99%   BMI 32.73 kg/m   Physical Exam Constitutional:      Appearance:  Normal appearance.  Cardiovascular:     Rate and Rhythm: Normal rate and regular rhythm.     Pulses: Normal pulses.     Heart sounds: Normal heart sounds.  Pulmonary:     Effort: Pulmonary effort is normal.  Breath sounds: Normal breath sounds.  Neurological:     Mental Status: She is alert.  Psychiatric:        Mood and Affect: Mood normal.        Behavior: Behavior normal.        Thought Content: Thought content normal.        Judgment: Judgment normal.    Assessment & Plan:   1. Mild episode of recurrent major depressive disorder [facility]) Patient reports history of depression. PHQ2 completed with a score of 0. Denies SI/HI. Reports her mood is well controlled with bupropion 300mg  daily and zoloft 100mg  daily. Needs refill today of bupropion.   - buPROPion (WELLBUTRIN XL) 300 MG 24 hr tablet; Take 1 tablet (300 mg total) by mouth daily.  Dispense: 90 tablet; Refill: 0  2. BMI 32.0-32.9,adult Patient reports utilizing phentermine in the past to help with weight loss. She reports she was taking this medication in the past but was unable to determine if she experienced weight loss- due to its short term use. Cardiovascular exam with heart regular rate and rhythm. Normal heart sounds, no murmurs present. No lower extremity edema present. Lungs clear to auscultation bilaterally. Educate about adverse side effects, including increased blood pressure, increased heart rate, insomnia, nervousness/restlessness, headache, dizziness, GI symptoms, mood changes, changes in libido, and issues of tolerance/dependence. Advised her to contact the office if she experiences any side effects while taking this medication. Discussed long-term options regarding weight loss options, she verbalized understanding that this medication is utilized for the short-term. Will provide refill for short-term use today.  - phentermine 15 MG capsule; Take 1 capsule (15 mg total) by mouth in the morning.  Dispense: 30 capsule;  Refill: 0   Return in about 8 weeks (around 06/16/2023) for medication management/weight follow-up.   Alyson Reedy, FNP

## 2023-04-23 ENCOUNTER — Encounter (HOSPITAL_BASED_OUTPATIENT_CLINIC_OR_DEPARTMENT_OTHER): Payer: Self-pay | Admitting: Family Medicine

## 2023-06-15 ENCOUNTER — Other Ambulatory Visit (HOSPITAL_BASED_OUTPATIENT_CLINIC_OR_DEPARTMENT_OTHER): Payer: Self-pay | Admitting: Family Medicine

## 2023-06-15 ENCOUNTER — Other Ambulatory Visit (HOSPITAL_BASED_OUTPATIENT_CLINIC_OR_DEPARTMENT_OTHER): Payer: Self-pay

## 2023-06-15 MED ORDER — SERTRALINE HCL 100 MG PO TABS
100.0000 mg | ORAL_TABLET | Freq: Every morning | ORAL | 3 refills | Status: DC
  Filled 2023-06-15: qty 90, 90d supply, fill #0
  Filled 2023-09-29: qty 90, 90d supply, fill #1
  Filled 2024-01-15: qty 90, 90d supply, fill #2

## 2023-06-16 ENCOUNTER — Ambulatory Visit (HOSPITAL_BASED_OUTPATIENT_CLINIC_OR_DEPARTMENT_OTHER): Payer: 59 | Admitting: Family Medicine

## 2023-06-21 ENCOUNTER — Encounter (HOSPITAL_BASED_OUTPATIENT_CLINIC_OR_DEPARTMENT_OTHER): Payer: Self-pay | Admitting: Family Medicine

## 2023-07-19 NOTE — Progress Notes (Signed)
   Established Patient Office Visit  Subjective   Patient ID: Catherine Cunningham, female    DOB: 07-07-1966  Age: 57 y.o. MRN: [id number]  Chief Complaint  Patient presents with   Weight Loss    Phentermine did not make her feel the best   Catherine Cunningham is a 57 year-old female patient who presents today for medical management of weight loss.   Has not lost weight, but is not gaining. She reports she does not feel motivated to work-out and continues to "eat what I want," including sweets, junk food, chips. "I cannot get control of my appetite."  She reports taking taking phentermine 15mg  daily for about 2-3 weeks but stopped taking it due to side effects, including jitteriness and increase in anxiety. She would like to try another medication option.    Review of Systems  Constitutional:  Negative for malaise/fatigue and weight loss.  Eyes:  Negative for blurred vision and double vision.  Respiratory:  Negative for cough and shortness of breath.   Cardiovascular:  Negative for chest pain and palpitations.  Gastrointestinal:  Negative for abdominal pain, nausea and vomiting.  Neurological:  Negative for dizziness and headaches.  Psychiatric/Behavioral:  Negative for depression, substance abuse and suicidal ideas. The patient is not nervous/anxious.       Objective:     BP (!) 122/96   Pulse 90   Ht 5\' 7"  (1.702 m)   Wt 209 lb (94.8 kg)   SpO2 98%   BMI 32.73 kg/m  BP Readings from Last 3 Encounters:  07/21/23 (!) 122/96  04/21/23 121/89  09/07/22 107/70     Physical Exam Constitutional:      Appearance: Normal appearance. She is obese.  Cardiovascular:     Rate and Rhythm: Normal rate and regular rhythm.     Heart sounds: Normal heart sounds.  Pulmonary:     Effort: Pulmonary effort is normal.     Breath sounds: Normal breath sounds.  Neurological:     Mental Status: She is alert.       Assessment & Plan:   1. Class 1 obesity due to excess calories without  serious comorbidity with body mass index (BMI) of 32.0 to 32.9 in adult Patient presents today for weight loss management. Patient last visit was 94.8kg and is the same weight today. She reports taking phentermine 15mg  daily for about 20 days, but stopped taking it due to adverse side effects. She reports she has been struggling with motivation to be physically active and consume healthy foods. She would like to try another weight loss medication. She would like to trial [facility] for weight loss management. Discussed slow weight loss at lower doses, medication side effects, when to take, and how to administer. Patient has no additional questions at this time. Plan to follow-up in 4 weeks for weight loss management.  - Semaglutide-Weight Management (WEGOVY) 0.25 MG/0.5ML SOAJ; Inject 0.25 mg into the skin once a week.  Dispense: 2 mL; Refill: 0  2. Atopic dermatitis, unspecified type Patient has three reddened areas on her shins that she reports have been present for a while. She has tried triamcinolone cream with no relief. Derm referral placed.  - Ambulatory referral to Dermatology    Return in about 4 weeks (around 08/18/2023) for weight loss f/u .    Alyson Reedy, FNP

## 2023-07-21 ENCOUNTER — Ambulatory Visit (HOSPITAL_BASED_OUTPATIENT_CLINIC_OR_DEPARTMENT_OTHER): Payer: 59 | Admitting: Family Medicine

## 2023-07-21 ENCOUNTER — Other Ambulatory Visit (HOSPITAL_BASED_OUTPATIENT_CLINIC_OR_DEPARTMENT_OTHER): Payer: Self-pay

## 2023-07-21 ENCOUNTER — Encounter (HOSPITAL_BASED_OUTPATIENT_CLINIC_OR_DEPARTMENT_OTHER): Payer: Self-pay | Admitting: Family Medicine

## 2023-07-21 ENCOUNTER — Ambulatory Visit (INDEPENDENT_AMBULATORY_CARE_PROVIDER_SITE_OTHER): Payer: 59 | Admitting: Family Medicine

## 2023-07-21 VITALS — BP 122/96 | HR 90 | Ht 67.0 in | Wt 209.0 lb

## 2023-07-21 DIAGNOSIS — E6609 Other obesity due to excess calories: Secondary | ICD-10-CM

## 2023-07-21 DIAGNOSIS — L209 Atopic dermatitis, unspecified: Secondary | ICD-10-CM | POA: Diagnosis not present

## 2023-07-21 DIAGNOSIS — Z6832 Body mass index (BMI) 32.0-32.9, adult: Secondary | ICD-10-CM

## 2023-07-21 MED ORDER — WEGOVY 0.25 MG/0.5ML ~~LOC~~ SOAJ
0.2500 mg | SUBCUTANEOUS | 0 refills | Status: DC
Start: 2023-07-21 — End: 2024-01-23
  Filled 2023-07-21 – 2023-12-04 (×3): qty 2, 28d supply, fill #0

## 2023-07-22 ENCOUNTER — Other Ambulatory Visit (HOSPITAL_BASED_OUTPATIENT_CLINIC_OR_DEPARTMENT_OTHER): Payer: Self-pay

## 2023-07-26 ENCOUNTER — Encounter (HOSPITAL_BASED_OUTPATIENT_CLINIC_OR_DEPARTMENT_OTHER): Payer: Self-pay

## 2023-07-27 ENCOUNTER — Other Ambulatory Visit (HOSPITAL_BASED_OUTPATIENT_CLINIC_OR_DEPARTMENT_OTHER): Payer: Self-pay

## 2023-07-28 ENCOUNTER — Other Ambulatory Visit (HOSPITAL_BASED_OUTPATIENT_CLINIC_OR_DEPARTMENT_OTHER): Payer: Self-pay

## 2023-07-30 ENCOUNTER — Other Ambulatory Visit (HOSPITAL_BASED_OUTPATIENT_CLINIC_OR_DEPARTMENT_OTHER): Payer: Self-pay

## 2023-08-02 ENCOUNTER — Ambulatory Visit: Payer: 59 | Admitting: Family Medicine

## 2023-08-08 ENCOUNTER — Other Ambulatory Visit (HOSPITAL_BASED_OUTPATIENT_CLINIC_OR_DEPARTMENT_OTHER): Payer: Self-pay

## 2023-08-18 ENCOUNTER — Encounter (HOSPITAL_BASED_OUTPATIENT_CLINIC_OR_DEPARTMENT_OTHER): Payer: Self-pay | Admitting: Family Medicine

## 2023-08-25 ENCOUNTER — Ambulatory Visit (HOSPITAL_BASED_OUTPATIENT_CLINIC_OR_DEPARTMENT_OTHER): Payer: 59 | Admitting: Family Medicine

## 2023-09-04 ENCOUNTER — Other Ambulatory Visit (HOSPITAL_BASED_OUTPATIENT_CLINIC_OR_DEPARTMENT_OTHER): Payer: Self-pay

## 2023-09-04 ENCOUNTER — Ambulatory Visit (INDEPENDENT_AMBULATORY_CARE_PROVIDER_SITE_OTHER): Payer: 59 | Admitting: Dermatology

## 2023-09-04 ENCOUNTER — Encounter: Payer: Self-pay | Admitting: Dermatology

## 2023-09-04 VITALS — BP 120/80 | HR 82

## 2023-09-04 DIAGNOSIS — L309 Dermatitis, unspecified: Secondary | ICD-10-CM

## 2023-09-04 DIAGNOSIS — R21 Rash and other nonspecific skin eruption: Secondary | ICD-10-CM

## 2023-09-04 MED ORDER — CLOBETASOL PROPIONATE 0.05 % EX OINT
TOPICAL_OINTMENT | CUTANEOUS | 1 refills | Status: DC
Start: 2023-09-04 — End: 2024-01-23
  Filled 2023-09-04: qty 30, 14d supply, fill #0
  Filled 2023-12-04: qty 30, 14d supply, fill #1

## 2023-09-04 MED ORDER — PIMECROLIMUS 1 % EX CREA
TOPICAL_CREAM | CUTANEOUS | 4 refills | Status: DC
Start: 2023-09-04 — End: 2024-01-23
  Filled 2023-09-04: qty 30, 14d supply, fill #0
  Filled 2023-12-04: qty 30, 14d supply, fill #1

## 2023-09-04 NOTE — Progress Notes (Signed)
   New Patient Visit   Subjective  Catherine Cunningham is a 57 y.o. female who presents for the following: rash on lower legs  Pt had first plaque for over a year but then developed other spots within the last 6 months. She said the spots get itchy. She has previously been treated for this condition with TAC 0.1% cream which didn't seem to help much. Pt has never had a skin cancer and doesn't have a family hx of skin cancer. She has never had a TBSE and refused to have one today.  The following portions of the chart were reviewed this encounter and updated as appropriate: medications, allergies, medical history  Review of Systems:  No other skin or systemic complaints except as noted in HPI or Assessment and Plan.  Objective  Well appearing patient in no apparent distress; mood and affect are within normal limits.   A focused examination was performed of the following areas: Lower legs  Relevant exam findings are noted in the Assessment and Plan.    Assessment & Plan   Dermatitis Unspecified- Psoriasis vs Nummular Dermatitis vs other  Exam: Scaly pink papules coalescing to plaques  Treatment Plan: Apply Clobetasol to affected area twice a day for 2-3 weeks then stop and use Pimecrolimus twice a day for 2 weeks.  Discussed doing biopsy if pt wants definitive diagnosis.  Discussed that plaques are non-pruritic and less classic of eczema No hx of diabetes or thyroid abnormalities Patient will follow up with Dr. Onalee Hua  - clobetasol ointment (TEMOVATE) 0.05 %; Apply to affected area twice a day for 2 weeks  Dispense: 30 g; Refill: 1 - pimecrolimus (ELIDEL) 1 % cream; Apply to affected area twice a day for 2 weeks that pt is taking a break from steroid  Dispense: 30 g; Refill: 4    Return if symptoms worsen or fail to improve.  I, Tillie Fantasia, CMA, am acting as scribe for Gwenith Daily, MD.   Documentation: I have reviewed the above documentation for accuracy and completeness,  and I agree with the above.  Gwenith Daily, MD

## 2023-09-04 NOTE — Patient Instructions (Addendum)

## 2023-09-05 ENCOUNTER — Other Ambulatory Visit (HOSPITAL_BASED_OUTPATIENT_CLINIC_OR_DEPARTMENT_OTHER): Payer: Self-pay

## 2023-09-05 ENCOUNTER — Other Ambulatory Visit: Payer: Self-pay

## 2023-09-06 ENCOUNTER — Other Ambulatory Visit (HOSPITAL_BASED_OUTPATIENT_CLINIC_OR_DEPARTMENT_OTHER): Payer: Self-pay

## 2023-09-06 ENCOUNTER — Other Ambulatory Visit: Payer: Self-pay

## 2023-09-09 ENCOUNTER — Other Ambulatory Visit (HOSPITAL_BASED_OUTPATIENT_CLINIC_OR_DEPARTMENT_OTHER): Payer: Self-pay

## 2023-09-22 ENCOUNTER — Other Ambulatory Visit (HOSPITAL_BASED_OUTPATIENT_CLINIC_OR_DEPARTMENT_OTHER): Payer: Self-pay

## 2023-11-16 ENCOUNTER — Encounter (HOSPITAL_BASED_OUTPATIENT_CLINIC_OR_DEPARTMENT_OTHER): Payer: Self-pay | Admitting: Family Medicine

## 2023-12-04 ENCOUNTER — Other Ambulatory Visit: Payer: Self-pay

## 2023-12-04 ENCOUNTER — Other Ambulatory Visit (HOSPITAL_BASED_OUTPATIENT_CLINIC_OR_DEPARTMENT_OTHER): Payer: Self-pay

## 2023-12-04 ENCOUNTER — Other Ambulatory Visit (HOSPITAL_BASED_OUTPATIENT_CLINIC_OR_DEPARTMENT_OTHER): Payer: Self-pay | Admitting: Family Medicine

## 2023-12-04 DIAGNOSIS — F330 Major depressive disorder, recurrent, mild: Secondary | ICD-10-CM

## 2023-12-04 MED ORDER — BUPROPION HCL ER (XL) 300 MG PO TB24
300.0000 mg | ORAL_TABLET | Freq: Every day | ORAL | 0 refills | Status: DC
Start: 2023-12-04 — End: 2024-06-19
  Filled 2023-12-04 – 2024-04-15 (×2): qty 90, 90d supply, fill #0

## 2023-12-05 ENCOUNTER — Other Ambulatory Visit: Payer: Self-pay

## 2023-12-11 ENCOUNTER — Other Ambulatory Visit (HOSPITAL_BASED_OUTPATIENT_CLINIC_OR_DEPARTMENT_OTHER): Payer: Self-pay

## 2023-12-15 ENCOUNTER — Other Ambulatory Visit (HOSPITAL_BASED_OUTPATIENT_CLINIC_OR_DEPARTMENT_OTHER): Payer: Self-pay

## 2023-12-18 ENCOUNTER — Other Ambulatory Visit (HOSPITAL_BASED_OUTPATIENT_CLINIC_OR_DEPARTMENT_OTHER): Payer: Self-pay

## 2023-12-18 MED ORDER — METHYLPREDNISOLONE 4 MG PO TBPK
ORAL_TABLET | ORAL | 1 refills | Status: DC
  Filled 2023-12-18: qty 21, 5d supply, fill #0

## 2023-12-18 MED ORDER — AMOXICILLIN 500 MG PO CAPS
ORAL_CAPSULE | ORAL | 0 refills | Status: AC
  Filled 2023-12-18: qty 30, 10d supply, fill #0

## 2023-12-19 ENCOUNTER — Other Ambulatory Visit (HOSPITAL_BASED_OUTPATIENT_CLINIC_OR_DEPARTMENT_OTHER): Payer: Self-pay

## 2024-01-15 ENCOUNTER — Other Ambulatory Visit (HOSPITAL_BASED_OUTPATIENT_CLINIC_OR_DEPARTMENT_OTHER): Payer: Self-pay

## 2024-01-22 ENCOUNTER — Ambulatory Visit: Payer: 59 | Admitting: Internal Medicine

## 2024-01-23 ENCOUNTER — Other Ambulatory Visit (HOSPITAL_BASED_OUTPATIENT_CLINIC_OR_DEPARTMENT_OTHER): Payer: Self-pay

## 2024-01-23 ENCOUNTER — Encounter: Payer: Self-pay | Admitting: Sports Medicine

## 2024-01-23 ENCOUNTER — Ambulatory Visit: Payer: Commercial Managed Care - PPO | Admitting: Sports Medicine

## 2024-01-23 VITALS — BP 124/88 | HR 89 | Temp 97.3°F | Resp 17 | Ht 67.0 in | Wt 220.8 lb

## 2024-01-23 DIAGNOSIS — F17200 Nicotine dependence, unspecified, uncomplicated: Secondary | ICD-10-CM

## 2024-01-23 DIAGNOSIS — R635 Abnormal weight gain: Secondary | ICD-10-CM | POA: Diagnosis not present

## 2024-01-23 DIAGNOSIS — R5383 Other fatigue: Secondary | ICD-10-CM

## 2024-01-23 DIAGNOSIS — K219 Gastro-esophageal reflux disease without esophagitis: Secondary | ICD-10-CM

## 2024-01-23 DIAGNOSIS — Z131 Encounter for screening for diabetes mellitus: Secondary | ICD-10-CM

## 2024-01-23 DIAGNOSIS — R911 Solitary pulmonary nodule: Secondary | ICD-10-CM

## 2024-01-23 DIAGNOSIS — F320 Major depressive disorder, single episode, mild: Secondary | ICD-10-CM

## 2024-01-23 DIAGNOSIS — Z6834 Body mass index (BMI) 34.0-34.9, adult: Secondary | ICD-10-CM | POA: Diagnosis not present

## 2024-01-23 MED ORDER — VENLAFAXINE HCL ER 75 MG PO CP24
75.0000 mg | ORAL_CAPSULE | Freq: Every day | ORAL | 0 refills | Status: DC
Start: 2024-01-23 — End: 2024-02-16
  Filled 2024-01-23: qty 30, 30d supply, fill #0

## 2024-01-23 NOTE — Progress Notes (Unsigned)
Careteam: Patient Care Team: [name], MD as PCP - General (Internal Medicine)  PLACE OF SERVICE:  [facility]  Advanced Directive information Does Patient Have a Medical Advance Directive?: No, Would patient like information on creating a medical advance directive?: No - Patient declined  No Known Allergies  Chief Complaint  Patient presents with   Establish Care    New patient.     Discussed the use of AI scribe software for clinical note transcription with the patient, who gave verbal consent to proceed.  History of Present Illness   The patient presents for establishing care and management of depression and weight gain.  She is seeking to establish care at the clinic after her previous primary care provider left. She has a history of depression and is currently taking Wellbutrin and Zoloft. Her depression is well-managed, with no feelings of being down, hopeless, or having thoughts of self-harm. She maintains interest in activities and scores zero on a depression severity scale.  She has experienced a weight gain of 25 pounds over the last two years, coinciding with the onset of working from home during the COVID-19 pandemic. She attributes part of the weight gain to a lack of motivation to exercise, despite maintaining a gym membership. She has attempted to address her weight through [employer] but feels unmotivated, especially as her husband does not participate in such activities. She is concerned about the cost and insurance coverage of weight loss medications like Wegovy and Trulicity, which she has considered but found financially inaccessible.   She smokes occasionally, about one cigarette a day, and more on weekends if consuming alcohol. She had a CT scan last year that showed a tiny lung nodule, and she is due for a follow-up scan. No respiratory symptoms such as fever, night sweats, cough, or breathing difficulties. She occasionally experiences heartburn,  for which she takes over-the-counter medication as needed.   No chest pain, palpitations, or breathing problems. No issues with urination, balance, or joint pain.         Review of Systems:  Review of Systems  Constitutional:  Negative for chills and fever.  HENT:  Negative for congestion and sore throat.   Eyes:  Negative for double vision.  Respiratory:  Negative for cough, sputum production and shortness of breath.   Cardiovascular:  Negative for chest pain, palpitations and leg swelling.  Gastrointestinal:  Negative for abdominal pain, heartburn and nausea.  Genitourinary:  Negative for dysuria, frequency and hematuria.  Musculoskeletal:  Negative for falls and myalgias.  Neurological:  Negative for dizziness, sensory change and focal weakness.  Psychiatric/Behavioral:  Negative for depression and suicidal ideas.    Negative unless indicated in HPI.   Past Medical History:  Diagnosis Date   Bilateral renal cysts    benign; stable   Cervical leiomyoma 02/2023   incidental finding on abd [place] for abd pain   Depression 1985   Encounter for screening mammogram for malignant neoplasm of breast 10/18/2020   GERD (gastroesophageal reflux disease)    Healthcare maintenance 02/22/2021   Mixed hyperlipidemia    cardiac CT calcium score 0   Neck pain    x10 years   Osteoarthritis of neck    right lower lobe pulmonary nodule    8 mm; stable 2/24; repeat in 10 months   Screening for colon cancer 02/22/2021   Shoulder pain, right    x10 years   Past Surgical History:  Procedure Laterality Date   BREAST BIOPSY Left  2014   neg- Ribbon clip   BREAST BIOPSY Left 10/24/2018   Affirm Bx- Coil Clip- benign   INJECTION ANESTHETIC AGENT TO CERVICAL PLEXUS     Social History:   reports that she has been smoking cigarettes. She has never used smokeless tobacco. She reports current alcohol use of about 14.0 standard drinks of alcohol per week. She reports that she does not use  drugs.  Family History  Problem Relation Age of Onset   Breast cancer Mother 1       stage 1 2008   Hypertension Mother    Hodgkin's lymphoma Father    Anuerysm Father    Emphysema Father    Lung cancer Father    Diabetes Mellitus I Brother    Heart attack Brother    Coronary artery disease Brother    Sudden Cardiac Death Brother        AMI- possibly covid related    Medications: Patient's Medications  New Prescriptions   No medications on file  Previous Medications   BUPROPION (WELLBUTRIN XL) 300 MG 24 HR TABLET    Take 1 tablet (300 mg total) by mouth daily.   SERTRALINE (ZOLOFT) 100 MG TABLET    Take 1 tablet (100 mg total) by mouth in the morning.  Modified Medications   No medications on file  Discontinued Medications   CLOBETASOL OINTMENT (TEMOVATE) 0.05 %    Apply to affected area twice a day for 2 weeks   METHYLPREDNISOLONE (MEDROL) 4 MG TBPK TABLET    TAKE AS DIRECTED.   PIMECROLIMUS (ELIDEL) 1 % CREAM    Apply to affected area twice a day for 2 weeks that pt is taking a break from steroid   SEMAGLUTIDE-WEIGHT MANAGEMENT (WEGOVY) 0.25 MG/0.5ML SOAJ    Inject 0.25 mg into the skin once a week.    Physical Exam: Vitals:   01/23/24 1411  BP: 124/88  Pulse: 89  Resp: 17  Temp: (!) 97.3 F (36.3 C)  SpO2: 96%  Weight: 220 lb 12.8 oz (100.2 kg)  Height: 5\' 7"  (1.702 m)   Body mass index is 34.58 kg/m. BP Readings from Last 3 Encounters:  01/23/24 124/88  09/04/23 120/80  07/21/23 (!) 122/96   Wt Readings from Last 3 Encounters:  01/23/24 220 lb 12.8 oz (100.2 kg)  07/21/23 209 lb (94.8 kg)  04/21/23 209 lb (94.8 kg)    Physical Exam Constitutional:      Appearance: Normal appearance.  HENT:     Head: Normocephalic and atraumatic.  Cardiovascular:     Rate and Rhythm: Normal rate and regular rhythm.  Pulmonary:     Effort: Pulmonary effort is normal. No respiratory distress.     Breath sounds: Normal breath sounds. No wheezing.  Abdominal:      General: Bowel sounds are normal. There is no distension.     Tenderness: There is no abdominal tenderness. There is no guarding or rebound.     Comments:    Musculoskeletal:        General: No swelling or tenderness.  Skin:    General: Skin is dry.  Neurological:     Mental Status: She is alert. Mental status is at baseline.     Sensory: No sensory deficit.     Motor: No weakness.     Labs reviewed: Basic Metabolic Panel: No results for input(s): "NA", "K", "CL", "CO2", "GLUCOSE", "BUN", "CREATININE", "CALCIUM", "MG", "PHOS", "TSH" in the last 8760 hours. Liver Function Tests: No results for  input(s): "AST", "ALT", "ALKPHOS", "BILITOT", "PROT", "ALBUMIN" in the last 8760 hours. No results for input(s): "LIPASE", "AMYLASE" in the last 8760 hours. No results for input(s): "AMMONIA" in the last 8760 hours. CBC: No results for input(s): "WBC", "NEUTROABS", "HGB", "HCT", "MCV", "PLT" in the last 8760 hours. Lipid Panel: No results for input(s): "CHOL", "HDL", "LDLCALC", "TRIG", "CHOLHDL", "LDLDIRECT" in the last 8760 hours. TSH: No results for input(s): "TSH" in the last 8760 hours. A1C: No results found for: "HGBA1C"   Assessment/Plan  Weight Gain Patient reports significant weight gain over the past two years, associated with decreased physical activity and dietary changes. Discussed potential impact of depression medication on weight. -Encourage regular physical activity and healthy diet. -Consider referral to nutritionist or weight loss program if no improvement with lifestyle modifications.     Gastroesophageal reflux disease, unspecified whether esophagitis present (Primary)  Occasional symptoms, managed with as-needed Protonix. -Continue Protonix as needed.  Current mild episode of major depressive disorder without prior episode (HCC) Well controlled on Wellbutrin and Zoloft. No current feelings of depression or loss of interest in activities. No suicidal ideation.  Discussed potential weight gain side effect of Zoloft. -Replace Zoloft with Effexor 75mg  daily to potentially mitigate weight gain. -Continue Wellbutrin. -Schedule follow-up in 4 weeks to assess response to medication change.  venlafaxine XR (EFFEXOR XR) 75 MG 24 hr capsule; Take 1 capsule (75 mg total) by mouth daily with breakfast.  Dispense: 30 capsule; Refill: 0     Other fatigue  CBC With Differential/Platelet - Complete Metabolic Panel with eGFR - TSH  Screening for diabetes mellitus  Hemoglobin A1C  6. BMI 34.0-34.9,adult  Lipid Panel  Smoking  Lung nodule  Denies cough, sob, fevers, night sweats  - CT Chest Wo Contrast; Future     General Health Maintenance -Order comprehensive lab work, including lipid panel, diabetes screening, blood counts, kidney and liver function tests. -Discuss pneumonia vaccination at next visit due to history of smoking. -Schedule follow-up visit in 4 weeks to review lab results and assess response to medication change.  30 min Total time spent for obtaining history,  performing a medically appropriate examination and evaluation, reviewing the tests,ordering  tests,  documenting clinical information in the electronic or other health record,  ,care coordination (not separately reported)

## 2024-01-24 ENCOUNTER — Other Ambulatory Visit (HOSPITAL_BASED_OUTPATIENT_CLINIC_OR_DEPARTMENT_OTHER): Payer: Self-pay

## 2024-01-24 MED ORDER — CEPHALEXIN 500 MG PO CAPS
1000.0000 mg | ORAL_CAPSULE | ORAL | 0 refills | Status: DC
  Filled 2024-01-24: qty 2, 1d supply, fill #0

## 2024-01-26 ENCOUNTER — Other Ambulatory Visit (HOSPITAL_BASED_OUTPATIENT_CLINIC_OR_DEPARTMENT_OTHER): Payer: Self-pay

## 2024-01-26 ENCOUNTER — Other Ambulatory Visit: Payer: Commercial Managed Care - PPO

## 2024-01-26 DIAGNOSIS — R5383 Other fatigue: Secondary | ICD-10-CM | POA: Diagnosis not present

## 2024-01-26 DIAGNOSIS — Z131 Encounter for screening for diabetes mellitus: Secondary | ICD-10-CM | POA: Diagnosis not present

## 2024-01-26 DIAGNOSIS — Z6834 Body mass index (BMI) 34.0-34.9, adult: Secondary | ICD-10-CM | POA: Diagnosis not present

## 2024-01-26 MED ORDER — HYDROCODONE-ACETAMINOPHEN 10-325 MG PO TABS
1.0000 | ORAL_TABLET | Freq: Four times a day (QID) | ORAL | 0 refills | Status: DC | PRN
  Filled 2024-01-26: qty 12, 3d supply, fill #0

## 2024-01-26 MED ORDER — CEPHALEXIN 500 MG PO CAPS
500.0000 mg | ORAL_CAPSULE | Freq: Two times a day (BID) | ORAL | 0 refills | Status: DC
  Filled 2024-01-26: qty 10, 5d supply, fill #0

## 2024-01-27 LAB — CBC WITH DIFFERENTIAL/PLATELET
Absolute Lymphocytes: 1365 {cells}/uL (ref 850–3900)
Absolute Monocytes: 353 {cells}/uL (ref 200–950)
Basophils Absolute: 41 {cells}/uL (ref 0–200)
Basophils Relative: 1 %
Eosinophils Absolute: 123 {cells}/uL (ref 15–500)
Eosinophils Relative: 3 %
HCT: 44.9 % (ref 35.0–45.0)
Hemoglobin: 15.2 g/dL (ref 11.7–15.5)
MCH: 29.5 pg (ref 27.0–33.0)
MCHC: 33.9 g/dL (ref 32.0–36.0)
MCV: 87.2 fL (ref 80.0–100.0)
MPV: 10.9 fL (ref 7.5–12.5)
Monocytes Relative: 8.6 %
Neutro Abs: 2218 {cells}/uL (ref 1500–7800)
Neutrophils Relative %: 54.1 %
Platelets: 373 10*3/uL (ref 140–400)
RBC: 5.15 10*6/uL — ABNORMAL HIGH (ref 3.80–5.10)
RDW: 12.8 % (ref 11.0–15.0)
Total Lymphocyte: 33.3 %
WBC: 4.1 10*3/uL (ref 3.8–10.8)

## 2024-01-27 LAB — COMPLETE METABOLIC PANEL WITH GFR
AG Ratio: 2.1 (calc) (ref 1.0–2.5)
ALT: 23 U/L (ref 6–29)
AST: 18 U/L (ref 10–35)
Albumin: 4.8 g/dL (ref 3.6–5.1)
Alkaline phosphatase (APISO): 89 U/L (ref 37–153)
BUN: 20 mg/dL (ref 7–25)
CO2: 27 mmol/L (ref 20–32)
Calcium: 9.8 mg/dL (ref 8.6–10.4)
Chloride: 106 mmol/L (ref 98–110)
Creat: 0.9 mg/dL (ref 0.50–1.05)
Globulin: 2.3 g/dL (ref 1.9–3.7)
Glucose, Bld: 99 mg/dL (ref 65–99)
Potassium: 5.1 mmol/L (ref 3.5–5.3)
Sodium: 142 mmol/L (ref 135–146)
Total Bilirubin: 0.5 mg/dL (ref 0.2–1.2)
Total Protein: 7.1 g/dL (ref 6.1–8.1)
eGFR: 73 mL/min/{1.73_m2} (ref >=60)

## 2024-01-27 LAB — HEMOGLOBIN A1C
Hgb A1c MFr Bld: 5.7 %{Hb} — ABNORMAL HIGH (ref <5.7)
Mean Plasma Glucose: 117 mg/dL
eAG (mmol/L): 6.5 mmol/L

## 2024-01-27 LAB — LIPID PANEL
Cholesterol: 259 mg/dL — ABNORMAL HIGH (ref <200)
HDL: 76 mg/dL (ref >=50)
LDL Cholesterol (Calc): 165 mg/dL — ABNORMAL HIGH
Non-HDL Cholesterol (Calc): 183 mg/dL — ABNORMAL HIGH (ref <130)
Total CHOL/HDL Ratio: 3.4 (calc) (ref <5.0)
Triglycerides: 80 mg/dL (ref <150)

## 2024-01-27 LAB — TSH: TSH: 1.56 m[IU]/L (ref 0.40–4.50)

## 2024-01-30 ENCOUNTER — Encounter: Payer: Self-pay | Admitting: Sports Medicine

## 2024-01-30 NOTE — Telephone Encounter (Signed)
Message routed to PCP [name], MD

## 2024-02-16 ENCOUNTER — Other Ambulatory Visit: Payer: Self-pay | Admitting: Sports Medicine

## 2024-02-16 ENCOUNTER — Other Ambulatory Visit (HOSPITAL_BASED_OUTPATIENT_CLINIC_OR_DEPARTMENT_OTHER): Payer: Self-pay

## 2024-02-16 DIAGNOSIS — F320 Major depressive disorder, single episode, mild: Secondary | ICD-10-CM

## 2024-02-16 MED ORDER — VENLAFAXINE HCL ER 75 MG PO CP24
75.0000 mg | ORAL_CAPSULE | Freq: Every day | ORAL | 0 refills | Status: DC
  Filled 2024-02-16: qty 90, 90d supply, fill #0

## 2024-02-20 ENCOUNTER — Other Ambulatory Visit (HOSPITAL_BASED_OUTPATIENT_CLINIC_OR_DEPARTMENT_OTHER): Payer: Self-pay

## 2024-02-20 ENCOUNTER — Encounter: Payer: Self-pay | Admitting: Sports Medicine

## 2024-02-20 ENCOUNTER — Ambulatory Visit: Payer: Commercial Managed Care - PPO | Admitting: Sports Medicine

## 2024-02-20 VITALS — BP 122/88 | HR 93 | Temp 96.8°F | Resp 17 | Ht 67.0 in | Wt 220.2 lb

## 2024-02-20 DIAGNOSIS — F320 Major depressive disorder, single episode, mild: Secondary | ICD-10-CM

## 2024-02-20 DIAGNOSIS — G8929 Other chronic pain: Secondary | ICD-10-CM

## 2024-02-20 DIAGNOSIS — E875 Hyperkalemia: Secondary | ICD-10-CM

## 2024-02-20 DIAGNOSIS — R1012 Left upper quadrant pain: Secondary | ICD-10-CM | POA: Diagnosis not present

## 2024-02-20 MED ORDER — BACLOFEN 5 MG PO TABS
5.0000 mg | ORAL_TABLET | Freq: Every day | ORAL | 0 refills | Status: DC
  Filled 2024-02-20: qty 30, 30d supply, fill #0

## 2024-02-20 NOTE — Progress Notes (Signed)
 Careteam: Patient Care Team: [name], MD as PCP - General (Internal Medicine)  PLACE OF SERVICE:  [facility]  Advanced Directive information Does Patient Have a Medical Advance Directive?: No, Would patient like information on creating a medical advance directive?: No - Patient declined  No Known Allergies  Chief Complaint  Patient presents with   Follow-up    New patient 4 week follow up.     Discussed the use of AI scribe software for clinical note transcription with the patient, who gave verbal consent to proceed.  History of Present Illness   Catherine Cunningham is a 58 year old female who presents with side pain.  She has been experiencing right-sided rib pain since January, which began after moving a large piece of furniture. The pain is constant, located under her ribs, and has worsened over the past few weeks. It is exacerbated by activities such as driving over bumps, coughing, sneezing, laughing, and lying on her side. The pain sometimes makes it difficult to breathe deeply and is tender to touch. No recent trauma or injury to the area. No associated symptoms such as nausea, vomiting, or changes in bowel habits.  Initially managed the pain with ibuprofen but discontinued its use due to a dental procedure in January that required additional pain management. She has not taken ibuprofen recently and is unsure if the pain is due to a pulled muscle or another cause.  Recent blood work was normal except for a slightly elevated potassium level, which she attributes to dietary intake, as she consumes bananas a couple of times a week.  In terms of her mood, she feels okay with no side effects from her current medications. No feelings of depression, lack of interest, or thoughts of self-harm. No issues with sleep.         Review of Systems:  Review of Systems  Constitutional:  Negative for chills and fever.  HENT:  Negative for congestion and sore throat.   Eyes:   Negative for double vision.  Respiratory:  Negative for cough, sputum production and shortness of breath.   Cardiovascular:  Negative for chest pain, palpitations and leg swelling.  Gastrointestinal:  Negative for abdominal pain, heartburn and nausea.  Genitourinary:  Negative for dysuria, frequency and hematuria.  Musculoskeletal:  Positive for myalgias (left lower ribcage  pain). Negative for falls.  Neurological:  Negative for dizziness, sensory change and focal weakness.   Negative unless indicated in HPI.   Past Medical History:  Diagnosis Date   Bilateral renal cysts    benign; stable   Cervical leiomyoma 02/2023   incidental finding on abd [place] for abd pain   Depression 1985   Encounter for screening mammogram for malignant neoplasm of breast 10/18/2020   GERD (gastroesophageal reflux disease)    Healthcare maintenance 02/22/2021   Mixed hyperlipidemia    cardiac CT calcium score 0   Neck pain    x10 years   Osteoarthritis of neck    right lower lobe pulmonary nodule    8 mm; stable 2/24; repeat in 10 months   Screening for colon cancer 02/22/2021   Shoulder pain, right    x10 years   Past Surgical History:  Procedure Laterality Date   BREAST BIOPSY Left 2014   neg- Ribbon clip   BREAST BIOPSY Left 10/24/2018   Affirm Bx- Coil Clip- benign   INJECTION ANESTHETIC AGENT TO CERVICAL PLEXUS     Social History:   reports that she has been  smoking cigarettes. She has never used smokeless tobacco. She reports current alcohol use of about 14.0 standard drinks of alcohol per week. She reports that she does not use drugs.  Family History  Problem Relation Age of Onset   Breast cancer Mother 67       stage 1 2008   Hypertension Mother    Hodgkin's lymphoma Father    Anuerysm Father    Emphysema Father    Lung cancer Father    Diabetes Mellitus I Brother    Heart attack Brother    Coronary artery disease Brother    Sudden Cardiac Death Brother        AMI- possibly  covid related    Medications: Patient's Medications  New Prescriptions   BACLOFEN 5 MG TABS    Take 1 tablet (5 mg total) by mouth at bedtime.  Previous Medications   BUPROPION (WELLBUTRIN XL) 300 MG 24 HR TABLET    Take 1 tablet (300 mg total) by mouth daily.   VENLAFAXINE XR (EFFEXOR XR) 75 MG 24 HR CAPSULE    Take 1 capsule (75 mg total) by mouth daily with breakfast.  Modified Medications   No medications on file  Discontinued Medications   CEPHALEXIN (KEFLEX) 500 MG CAPSULE    Take 1 capsule (500 mg total) by mouth 2 (two) times daily until gone   HYDROCODONE-ACETAMINOPHEN (NORCO) 10-325 MG TABLET    Take 1 tablet by mouth every 6 (six) hours as needed for pain    Physical Exam: Vitals:   02/20/24 0830  BP: (!) 122/90  Pulse: 93  Resp: 17  Temp: (!) 96.8 F (36 C)  SpO2: 97%  Weight: 220 lb 3.2 oz (99.9 kg)  Height: 5\' 7"  (1.702 m)   Body mass index is 34.49 kg/m. BP Readings from Last 3 Encounters:  02/20/24 (!) 122/90  01/23/24 124/88  09/04/23 120/80   Wt Readings from Last 3 Encounters:  02/20/24 220 lb 3.2 oz (99.9 kg)  01/23/24 220 lb 12.8 oz (100.2 kg)  07/21/23 209 lb (94.8 kg)    Physical Exam Constitutional:      Appearance: Normal appearance.  HENT:     Head: Normocephalic and atraumatic.  Cardiovascular:     Rate and Rhythm: Normal rate and regular rhythm.  Pulmonary:     Effort: Pulmonary effort is normal. No respiratory distress.     Breath sounds: Normal breath sounds. No wheezing.  Abdominal:     General: Bowel sounds are normal. There is no distension.     Tenderness: There is no abdominal tenderness. There is no guarding or rebound.     Comments:    Musculoskeletal:        General: No swelling or tenderness.     Comments: Left lower rib , LUQ - tenderness  No rash or bruising noted  Neurological:     Mental Status: She is alert. Mental status is at baseline.     Sensory: No sensory deficit.     Motor: No weakness.     Labs  reviewed: Basic Metabolic Panel: Recent Labs    01/26/24 0903  NA 142  K 5.1  CL 106  CO2 27  GLUCOSE 99  BUN 20  CREATININE 0.90  CALCIUM 9.8  TSH 1.56   Liver Function Tests: Recent Labs    01/26/24 0903  AST 18  ALT 23  BILITOT 0.5  PROT 7.1   No results for input(s): "LIPASE", "AMYLASE" in the last 8760 hours. No  results for input(s): "AMMONIA" in the last 8760 hours. CBC: Recent Labs    01/26/24 0903  WBC 4.1  NEUTROABS 2,218  HGB 15.2  HCT 44.9  MCV 87.2  PLT 373   Lipid Panel: Recent Labs    01/26/24 0903  CHOL 259*  HDL 76  LDLCALC 165*  TRIG 80  CHOLHDL 3.4   TSH: Recent Labs    01/26/24 0903  TSH 1.56   A1C: Lab Results  Component Value Date   HGBA1C 5.7 (H) 01/26/2024    Assessment and Plan    LUQ pain Left-sided Rib Pain Chronic, intermittent pain under the left lower ribs. Pain is constant, worsens with deep breaths, coughing, sneezing, and pressure. No recent trauma.   -Order rib x-ray and abdominal ultrasound   -will start  Baclofen  5 mg as a muscle relaxant, to be taken at bedtime.  Informed patient about the side effects of the medication DG Ribs Unilateral Left; Future - US Abdomen Complete; Future  Depression  Reports improvement in mood, no current depressive symptoms, no suicidal ideation, good sleep, and good appetite. -Continue current treatment regimen. -Plan for follow-up in three months.        Hyperkalemia Avoid potassium rich foods Will check potassium level  - Potassium  Other orders - Baclofen 5 MG TABS; Take 1 tablet (5 mg total) by mouth at bedtime.  Dispense: 30 tablet; Refill: 0   Return in about 3 months (around 05/19/2024).[name]

## 2024-02-21 ENCOUNTER — Other Ambulatory Visit: Payer: Self-pay | Admitting: Sports Medicine

## 2024-02-21 DIAGNOSIS — Z1231 Encounter for screening mammogram for malignant neoplasm of breast: Secondary | ICD-10-CM

## 2024-02-21 LAB — POTASSIUM: Potassium: 4.8 mmol/L (ref 3.5–5.3)

## 2024-02-23 ENCOUNTER — Ambulatory Visit
Admission: RE | Admit: 2024-02-23 | Discharge: 2024-02-23 | Disposition: A | Discharge status: Home / Self Care | Payer: Commercial Managed Care - PPO | Source: Ambulatory Visit | Attending: Sports Medicine | Admitting: Sports Medicine

## 2024-02-23 ENCOUNTER — Other Ambulatory Visit (HOSPITAL_BASED_OUTPATIENT_CLINIC_OR_DEPARTMENT_OTHER): Payer: Self-pay

## 2024-02-23 ENCOUNTER — Other Ambulatory Visit: Payer: Self-pay | Admitting: Sports Medicine

## 2024-02-23 DIAGNOSIS — F320 Major depressive disorder, single episode, mild: Secondary | ICD-10-CM

## 2024-02-23 DIAGNOSIS — G8929 Other chronic pain: Secondary | ICD-10-CM

## 2024-02-23 DIAGNOSIS — R0781 Pleurodynia: Secondary | ICD-10-CM | POA: Diagnosis not present

## 2024-02-23 DIAGNOSIS — E875 Hyperkalemia: Secondary | ICD-10-CM

## 2024-02-23 DIAGNOSIS — R1012 Left upper quadrant pain: Secondary | ICD-10-CM | POA: Diagnosis not present

## 2024-03-01 ENCOUNTER — Encounter: Payer: Self-pay | Admitting: Sports Medicine

## 2024-04-04 ENCOUNTER — Ambulatory Visit
Admission: RE | Admit: 2024-04-04 | Discharge: 2024-04-04 | Disposition: A | Discharge status: Home / Self Care | Payer: Commercial Managed Care - PPO | Source: Ambulatory Visit | Attending: Sports Medicine | Admitting: Sports Medicine

## 2024-04-04 DIAGNOSIS — Z1231 Encounter for screening mammogram for malignant neoplasm of breast: Secondary | ICD-10-CM

## 2024-04-15 ENCOUNTER — Other Ambulatory Visit (HOSPITAL_BASED_OUTPATIENT_CLINIC_OR_DEPARTMENT_OTHER): Payer: Self-pay

## 2024-05-16 ENCOUNTER — Telehealth: Payer: Self-pay | Admitting: Sports Medicine

## 2024-05-16 NOTE — Telephone Encounter (Signed)
 Tye Gall, MD  [facility] minutes ago (2:25 PM)    Ok for video visit

## 2024-05-16 NOTE — Telephone Encounter (Signed)
 Patient called in and changed the date of her next appointment. She asked if she can have this as a video visit. Please advise

## 2024-05-22 ENCOUNTER — Ambulatory Visit: Payer: Commercial Managed Care - PPO | Admitting: Sports Medicine

## 2024-06-12 ENCOUNTER — Encounter: Admitting: Sports Medicine

## 2024-06-13 NOTE — Progress Notes (Signed)
 This encounter was created in error - please disregard.

## 2024-06-19 ENCOUNTER — Encounter: Payer: Self-pay | Admitting: Sports Medicine

## 2024-06-19 ENCOUNTER — Other Ambulatory Visit (HOSPITAL_BASED_OUTPATIENT_CLINIC_OR_DEPARTMENT_OTHER): Payer: Self-pay

## 2024-06-19 ENCOUNTER — Telehealth: Admitting: Sports Medicine

## 2024-06-19 DIAGNOSIS — E669 Obesity, unspecified: Secondary | ICD-10-CM

## 2024-06-19 DIAGNOSIS — F320 Major depressive disorder, single episode, mild: Secondary | ICD-10-CM | POA: Diagnosis not present

## 2024-06-19 DIAGNOSIS — F330 Major depressive disorder, recurrent, mild: Secondary | ICD-10-CM

## 2024-06-19 MED ORDER — VENLAFAXINE HCL ER 75 MG PO CP24
75.0000 mg | ORAL_CAPSULE | Freq: Every day | ORAL | 1 refills | Status: AC
Start: 2024-06-19 — End: ?
  Filled 2024-06-19: qty 90, 90d supply, fill #0
  Filled 2024-09-13 – 2024-09-14 (×2): qty 90, 90d supply, fill #1

## 2024-06-19 MED ORDER — BUPROPION HCL ER (XL) 300 MG PO TB24
300.0000 mg | ORAL_TABLET | Freq: Every day | ORAL | 3 refills | Status: AC
Start: 2024-06-19 — End: ?
  Filled 2024-06-19 – 2024-08-02 (×2): qty 90, 90d supply, fill #0
  Filled 2024-11-02: qty 90, 90d supply, fill #1

## 2024-06-19 NOTE — Progress Notes (Signed)
 This service is provided via telemedicine  No vital signs collected/recorded due to the encounter was a telemedicine visit.   Location of patient (ex: home, work):  home  Patient consents to a telephone visit: yes  Location of the provider (ex: office, home):  [facility]   Name of any referring provider:  N/A  Names of all persons participating in the telemedicine service and their role in the encounter:  Evertt Pereyra CMA, Sherlynn Madden MD and Patient  Time spent on call: 12

## 2024-06-19 NOTE — Progress Notes (Addendum)
 Virtual Visit via Video Note  I connected with Catherine Cunningham on 06/19/24 at  3:00 PM EDT by a video enabled telemedicine application and verified that I am speaking with the correct person using two identifiers.  Location: Patient: Catherine Cunningham Provider:  Dr Sam   I discussed the limitations of evaluation and management by telemedicine and the availability of in person appointments. The patient expressed understanding and agreed to proceed. Catherine Cunningham is a 58 year old female who presents for weight management and mood evaluation.  She has been experiencing difficulty with weight management. Her insurance does not cover medications like Wegovy  or Ozempic  . Previous weight loss medications caused side effects such as bloating and nausea without significant weight loss. Her weight was recorded at 220 pounds during the last visit.  Regarding her mood, Wellbutrin  is effective. She denies feeling depressed or down and reports feeling motivated and interested in activities. She is currently taking venlafaxine  and Wellbutrin , with a recent refill on Wellbutrin  noted.  No breathing problems, shortness of breath, cough, sore throat, congestion, heartburn, nausea, vomiting, constipation, blood in urine or stool, joint pains, dizziness, lightheadedness, tingling, numbness, or sleep problems.  History of Present Illness: Review of Systems  HENT:  Negative for congestion and sore throat.   Respiratory:  Negative for cough and sputum production.   Cardiovascular:  Negative for chest pain and leg swelling.  Gastrointestinal:  Negative for abdominal pain, heartburn, nausea and vomiting.  Genitourinary:  Negative for dysuria and hematuria.  Musculoskeletal:  Negative for falls and joint pain.  Neurological:  Negative for dizziness.  Psychiatric/Behavioral:  Negative for depression and suicidal ideas. The patient is not nervous/anxious and does not have insomnia.       A/p 1. Current  mild episode of major depressive disorder without prior episode (HCC) Doing better Refilled effexor  - venlafaxine  XR (EFFEXOR  XR) 75 MG 24 hr capsule; Take 1 capsule (75 mg total) by mouth daily with breakfast.  Dispense: 90 capsule; Refill: 1  buPROPion  (WELLBUTRIN  XL) 300 MG 24 hr tablet; Take 1 tablet (300 mg total) by mouth daily.  Dispense: 90 tablet; Refill: 3  3. Obesity, unspecified class, unspecified obesity type, unspecified whether serious comorbidity present (Primary) Instructed to exercise regularly   Other orders - clobetasol  ointment (TEMOVATE ) 0.05 %; Apply 1 Application topically 2 (two) times daily.   Follow Up Instructions:    I discussed the assessment and treatment plan with the patient. The patient was provided an opportunity to ask questions and all were answered. The patient agreed with the plan and demonstrated an understanding of the instructions.   The patient was advised to call back or seek an in-person evaluation if the symptoms worsen or if the condition fails to improve as anticipated.  I provided  15 min minutes of non-face-to-face time during this encounter.

## 2024-06-20 ENCOUNTER — Other Ambulatory Visit: Payer: Self-pay

## 2024-06-21 ENCOUNTER — Encounter: Payer: Self-pay | Admitting: Sports Medicine

## 2024-07-23 ENCOUNTER — Encounter: Payer: Self-pay | Admitting: Sports Medicine

## 2024-07-23 ENCOUNTER — Ambulatory Visit (INDEPENDENT_AMBULATORY_CARE_PROVIDER_SITE_OTHER): Admitting: Sports Medicine

## 2024-07-23 VITALS — BP 122/86 | HR 84 | Temp 98.0°F | Resp 14 | Ht 67.0 in | Wt 219.2 lb

## 2024-07-23 DIAGNOSIS — Z87891 Personal history of nicotine dependence: Secondary | ICD-10-CM

## 2024-07-23 DIAGNOSIS — M79671 Pain in right foot: Secondary | ICD-10-CM

## 2024-07-23 DIAGNOSIS — Z0000 Encounter for general adult medical examination without abnormal findings: Secondary | ICD-10-CM | POA: Diagnosis not present

## 2024-07-23 DIAGNOSIS — F323 Major depressive disorder, single episode, severe with psychotic features: Secondary | ICD-10-CM

## 2024-07-23 DIAGNOSIS — K219 Gastro-esophageal reflux disease without esophagitis: Secondary | ICD-10-CM | POA: Diagnosis not present

## 2024-07-23 NOTE — Progress Notes (Signed)
 Careteam: Patient Care Team: Sherlynn Madden, MD as PCP - General (Internal Medicine)  PLACE OF SERVICE:  [facility]  Advanced Directive information    No Known Allergies  Chief Complaint  Patient presents with   Annual Exam     Discussed the use of AI scribe software for clinical note transcription with the patient, who gave verbal consent to proceed.  History of Present Illness Catherine Cunningham is a 58 year old female who presents for an annual physical exam.  Her mood has been stable and improved since starting Effexor  earlier this year. She engages in regular exercise and has established a morning routine. She experiences good sleep, though some work-related stress is present. No feelings of crying or excessive worry.  No shortness of breath, chest pain, dizziness, or palpitations with exertion. She experiences heartburn or acid reflux, managed with over-the-counter medication as needed, often related to dietary choices or stress. No blood in stool.  She has a history of occasional smoking, typically when having a cocktail, estimating about half a pack per week. She has been smoking for approximately thirty years, with periods of cessation, including a five-year quit period.  She reports pain on the top of her right foot, which she thinks may be due to a bone spur. The pain occurs when walking or standing for long periods and is sometimes accompanied by swelling. She manages the pain with ice application.  No urinary incontinence, cough, night sweats, or joint pains beyond the foot issue. She had a colonoscopy with no polyps found and a mammogram in April, with the next one scheduled. Lipid Panel     Component Value Date/Time   CHOL 259 (H) 01/26/2024 0903   CHOL 234 (H) 10/23/2018 0853   TRIG 80 01/26/2024 0903   HDL 76 01/26/2024 0903   HDL 85 10/23/2018 0853   CHOLHDL 3.4 01/26/2024 0903   LDLCALC 165 (H) 01/26/2024 0903   LABVLDL 17 10/23/2018 0853       The 10-year ASCVD risk score (Arnett DK, et al., 2019) is: 6.8%   Values used to calculate the score:     Age: 30 years     Clincally relevant sex: Female     Is Non-Hispanic African American: No     Diabetic: No     Tobacco smoker: Yes     Systolic Blood Pressure: 128 mmHg     Is BP treated: No     HDL Cholesterol: 76 mg/dL     Total Cholesterol: 259 mg/dL   Review of Systems:  Review of Systems  Constitutional:  Negative for chills and fever.  HENT:  Negative for congestion and sore throat.   Eyes:  Negative for double vision.  Respiratory:  Negative for cough, sputum production and shortness of breath.   Cardiovascular:  Negative for chest pain, palpitations and leg swelling.  Gastrointestinal:  Negative for abdominal pain, heartburn and nausea.  Genitourinary:  Negative for dysuria, frequency and hematuria.  Musculoskeletal:  Negative for falls and myalgias.  Neurological:  Negative for dizziness.  Psychiatric/Behavioral:  Negative for depression.    Negative unless indicated in HPI.   Past Medical History:  Diagnosis Date   Bilateral renal cysts    benign; stable   Cervical leiomyoma 02/2023   incidental finding on abd US  for abd pain   Depression 1985   Encounter for screening mammogram for malignant neoplasm of breast 10/18/2020   GERD (gastroesophageal reflux disease)    Healthcare maintenance 02/22/2021  Mixed hyperlipidemia    cardiac CT calcium score 0   Neck pain    x10 years   Osteoarthritis of neck    right lower lobe pulmonary nodule    8 mm; stable 2/24; repeat in 10 months   Screening for colon cancer 02/22/2021   Shoulder pain, right    x10 years   Past Surgical History:  Procedure Laterality Date   BREAST BIOPSY Left 2014   neg- Ribbon clip   BREAST BIOPSY Left 10/24/2018   Affirm Bx- Coil Clip- benign   INJECTION ANESTHETIC AGENT TO CERVICAL PLEXUS     Social History:   reports that she has been smoking cigarettes. She has never used  smokeless tobacco. She reports current alcohol use of about 14.0 standard drinks of alcohol per week. She reports that she does not use drugs.  Family History  Problem Relation Age of Onset   Breast cancer Mother 40       stage 1 2008   Hypertension Mother    Hodgkin's lymphoma Father    Anuerysm Father    Emphysema Father    Lung cancer Father    Diabetes Mellitus I Brother    Heart attack Brother    Coronary artery disease Brother    Sudden Cardiac Death Brother        AMI- possibly covid related    Medications: Patient's Medications  New Prescriptions   No medications on file  Previous Medications   BUPROPION  (WELLBUTRIN  XL) 300 MG 24 HR TABLET    Take 1 tablet (300 mg total) by mouth daily.   CLOBETASOL  OINTMENT (TEMOVATE ) 0.05 %    Apply 1 Application topically 2 (two) times daily.   VENLAFAXINE  XR (EFFEXOR  XR) 75 MG 24 HR CAPSULE    Take 1 capsule (75 mg total) by mouth daily with breakfast.  Modified Medications   No medications on file  Discontinued Medications   No medications on file    Physical Exam: Vitals:   07/23/24 1438  BP: (!) 128/90  Pulse: 84  Resp: 14  Temp: 98 F (36.7 C)  SpO2: 96%  Weight: 219 lb 3.2 oz (99.4 kg)  Height: 5' 7 (1.702 m)   Body mass index is 34.33 kg/m. BP Readings from Last 3 Encounters:  07/23/24 (!) 128/90  02/20/24 122/88  01/23/24 124/88   Wt Readings from Last 3 Encounters:  07/23/24 219 lb 3.2 oz (99.4 kg)  02/20/24 220 lb 3.2 oz (99.9 kg)  01/23/24 220 lb 12.8 oz (100.2 kg)    Physical Exam Constitutional:      Appearance: Normal appearance.  HENT:     Head: Normocephalic and atraumatic.  Cardiovascular:     Rate and Rhythm: Normal rate and regular rhythm.  Pulmonary:     Effort: Pulmonary effort is normal. No respiratory distress.     Breath sounds: Normal breath sounds. No wheezing.  Abdominal:     General: Bowel sounds are normal. There is no distension.     Tenderness: There is no abdominal  tenderness. There is no guarding or rebound.     Comments:    Musculoskeletal:        General: No swelling.  Neurological:     Mental Status: She is alert. Mental status is at baseline.     Motor: No weakness.     Labs reviewed: Basic Metabolic Panel: Recent Labs    01/26/24 0903 02/20/24 0857  NA 142  --   K 5.1 4.8  CL  106  --   CO2 27  --   GLUCOSE 99  --   BUN 20  --   CREATININE 0.90  --   CALCIUM 9.8  --   TSH 1.56  --    Liver Function Tests: Recent Labs    01/26/24 0903  AST 18  ALT 23  BILITOT 0.5  PROT 7.1   No results for input(s): LIPASE, AMYLASE in the last 8760 hours. No results for input(s): AMMONIA in the last 8760 hours. CBC: Recent Labs    01/26/24 0903  WBC 4.1  NEUTROABS 2,218  HGB 15.2  HCT 44.9  MCV 87.2  PLT 373   Lipid Panel: Recent Labs    01/26/24 0903  CHOL 259*  HDL 76  LDLCALC 165*  TRIG 80  CHOLHDL 3.4   TSH: Recent Labs    01/26/24 0903  TSH 1.56   A1C: Lab Results  Component Value Date   HGBA1C 5.7 (H) 01/26/2024    Assessment and Plan Assessment & Plan  Encounter for annual physical exam (Primary) Doing fine Had mammogram 03/2024 Denies fevers, chills, cough, SOB, abdominal pain, nausea, vomiting Pt reports following wellness center and planning to increase her exercise regimen    Major depressive disorder, single episode with psychotic features (HCC)  Stable Cont with wellbutrin , effexor    Gastroesophageal reflux disease, unspecified whether esophagitis present  Denies acid reflux or heart burn Take omeprazole  prn    Right foot pain Intermittent Says she will call podiatry office for appt    Smoking  Occasional smoker, approximately half a pack per week,   Smoking history of thirty years with periods of cessation. - Advised to reduce smoking

## 2024-08-02 ENCOUNTER — Other Ambulatory Visit (HOSPITAL_BASED_OUTPATIENT_CLINIC_OR_DEPARTMENT_OTHER): Payer: Self-pay

## 2024-09-14 ENCOUNTER — Other Ambulatory Visit: Payer: Self-pay

## 2024-09-14 ENCOUNTER — Other Ambulatory Visit (HOSPITAL_BASED_OUTPATIENT_CLINIC_OR_DEPARTMENT_OTHER): Payer: Self-pay

## 2024-09-19 ENCOUNTER — Other Ambulatory Visit (HOSPITAL_BASED_OUTPATIENT_CLINIC_OR_DEPARTMENT_OTHER): Payer: Self-pay

## 2024-09-19 MED ORDER — FLUZONE 0.5 ML IM SUSY
0.5000 mL | PREFILLED_SYRINGE | Freq: Once | INTRAMUSCULAR | 0 refills | Status: AC
Start: 2024-09-19 — End: 2024-09-20
  Filled 2024-09-19: qty 0.5, 1d supply, fill #0

## 2024-10-01 DIAGNOSIS — R635 Abnormal weight gain: Secondary | ICD-10-CM | POA: Diagnosis not present

## 2024-10-01 DIAGNOSIS — Z6834 Body mass index (BMI) 34.0-34.9, adult: Secondary | ICD-10-CM | POA: Diagnosis not present

## 2024-10-01 DIAGNOSIS — L659 Nonscarring hair loss, unspecified: Secondary | ICD-10-CM | POA: Diagnosis not present

## 2024-10-01 DIAGNOSIS — Z01419 Encounter for gynecological examination (general) (routine) without abnormal findings: Secondary | ICD-10-CM | POA: Diagnosis not present

## 2024-10-01 DIAGNOSIS — Z124 Encounter for screening for malignant neoplasm of cervix: Secondary | ICD-10-CM | POA: Diagnosis not present

## 2024-10-01 DIAGNOSIS — F520 Hypoactive sexual desire disorder: Secondary | ICD-10-CM | POA: Diagnosis not present

## 2024-10-01 DIAGNOSIS — Z1151 Encounter for screening for human papillomavirus (HPV): Secondary | ICD-10-CM | POA: Diagnosis not present

## 2024-11-02 ENCOUNTER — Other Ambulatory Visit (HOSPITAL_BASED_OUTPATIENT_CLINIC_OR_DEPARTMENT_OTHER): Payer: Self-pay

## 2025-01-18 ENCOUNTER — Other Ambulatory Visit (HOSPITAL_BASED_OUTPATIENT_CLINIC_OR_DEPARTMENT_OTHER): Payer: Self-pay

## 2025-01-22 ENCOUNTER — Other Ambulatory Visit (HOSPITAL_BASED_OUTPATIENT_CLINIC_OR_DEPARTMENT_OTHER): Payer: Self-pay

## 2025-01-29 ENCOUNTER — Other Ambulatory Visit (HOSPITAL_BASED_OUTPATIENT_CLINIC_OR_DEPARTMENT_OTHER): Payer: Self-pay

## 2025-01-29 MED ORDER — VENLAFAXINE HCL ER 75 MG PO CP24
75.0000 mg | ORAL_CAPSULE | Freq: Every day | ORAL | 3 refills | Status: AC
  Filled 2025-01-29: qty 90, 90d supply, fill #0

## 2025-01-30 ENCOUNTER — Other Ambulatory Visit (HOSPITAL_COMMUNITY): Payer: Self-pay
